# Patient Record
Sex: Male | Born: 1940 | Race: White | Hispanic: No | Smoking: Never smoker
Health system: Southern US, Community
[De-identification: ages and names within clinical notes are randomized; demographics above are authoritative.]

## PROBLEM LIST (undated history)

## (undated) DIAGNOSIS — M479 Spondylosis, unspecified: Secondary | ICD-10-CM

## (undated) DIAGNOSIS — Z973 Presence of spectacles and contact lenses: Secondary | ICD-10-CM

## (undated) DIAGNOSIS — M810 Age-related osteoporosis without current pathological fracture: Secondary | ICD-10-CM

## (undated) DIAGNOSIS — K5790 Diverticulosis of intestine, part unspecified, without perforation or abscess without bleeding: Secondary | ICD-10-CM

## (undated) DIAGNOSIS — I10 Essential (primary) hypertension: Secondary | ICD-10-CM

## (undated) DIAGNOSIS — K219 Gastro-esophageal reflux disease without esophagitis: Secondary | ICD-10-CM

## (undated) DIAGNOSIS — Z87448 Personal history of other diseases of urinary system: Secondary | ICD-10-CM

## (undated) DIAGNOSIS — J309 Allergic rhinitis, unspecified: Secondary | ICD-10-CM

## (undated) DIAGNOSIS — N529 Male erectile dysfunction, unspecified: Secondary | ICD-10-CM

## (undated) DIAGNOSIS — Z794 Long term (current) use of insulin: Secondary | ICD-10-CM

## (undated) DIAGNOSIS — T4145XA Adverse effect of unspecified anesthetic, initial encounter: Secondary | ICD-10-CM

## (undated) DIAGNOSIS — M1990 Unspecified osteoarthritis, unspecified site: Secondary | ICD-10-CM

## (undated) DIAGNOSIS — T8859XA Other complications of anesthesia, initial encounter: Secondary | ICD-10-CM

## (undated) DIAGNOSIS — M5136 Other intervertebral disc degeneration, lumbar region: Secondary | ICD-10-CM

## (undated) DIAGNOSIS — N401 Enlarged prostate with lower urinary tract symptoms: Secondary | ICD-10-CM

## (undated) DIAGNOSIS — K635 Polyp of colon: Secondary | ICD-10-CM

## (undated) DIAGNOSIS — E785 Hyperlipidemia, unspecified: Secondary | ICD-10-CM

## (undated) DIAGNOSIS — L570 Actinic keratosis: Secondary | ICD-10-CM

## (undated) DIAGNOSIS — Z87442 Personal history of urinary calculi: Secondary | ICD-10-CM

## (undated) DIAGNOSIS — E119 Type 2 diabetes mellitus without complications: Secondary | ICD-10-CM

## (undated) DIAGNOSIS — M4800 Spinal stenosis, site unspecified: Secondary | ICD-10-CM

## (undated) HISTORY — DX: Other intervertebral disc degeneration, lumbar region: M51.36

## (undated) HISTORY — DX: Polyp of colon: K63.5

## (undated) HISTORY — DX: Diverticulosis of intestine, part unspecified, without perforation or abscess without bleeding: K57.90

## (undated) HISTORY — DX: Hyperlipidemia, unspecified: E78.5

## (undated) HISTORY — DX: Male erectile dysfunction, unspecified: N52.9

## (undated) HISTORY — PX: COLONOSCOPY: SHX174

## (undated) HISTORY — PX: FRACTURE SURGERY: SHX138

## (undated) HISTORY — DX: Spinal stenosis, site unspecified: M48.00

## (undated) HISTORY — DX: Actinic keratosis: L57.0

## (undated) HISTORY — DX: Spondylosis, unspecified: M47.9

## (undated) HISTORY — DX: Age-related osteoporosis without current pathological fracture: M81.0

## (undated) HISTORY — DX: Allergic rhinitis, unspecified: J30.9

## (undated) HISTORY — DX: Essential (primary) hypertension: I10

---

## 1973-07-02 HISTORY — PX: APPENDECTOMY: SHX54

## 1999-04-27 ENCOUNTER — Encounter: Admission: RE | Admit: 1999-04-27 | Discharge: 1999-04-27 | Payer: Self-pay

## 2001-09-08 ENCOUNTER — Ambulatory Visit (HOSPITAL_COMMUNITY): Admission: RE | Admit: 2001-09-08 | Discharge: 2001-09-08 | Payer: Self-pay | Admitting: Internal Medicine

## 2001-09-08 ENCOUNTER — Encounter: Payer: Self-pay | Admitting: Internal Medicine

## 2002-09-09 ENCOUNTER — Encounter: Admission: RE | Admit: 2002-09-09 | Discharge: 2002-12-08 | Payer: Self-pay | Admitting: Internal Medicine

## 2007-10-21 ENCOUNTER — Ambulatory Visit: Payer: Self-pay | Admitting: Internal Medicine

## 2007-11-04 ENCOUNTER — Ambulatory Visit: Payer: Self-pay | Admitting: Internal Medicine

## 2012-05-20 ENCOUNTER — Encounter: Payer: Self-pay | Admitting: Internal Medicine

## 2012-06-18 ENCOUNTER — Other Ambulatory Visit (INDEPENDENT_AMBULATORY_CARE_PROVIDER_SITE_OTHER): Payer: Medicare Other

## 2012-06-18 ENCOUNTER — Ambulatory Visit (INDEPENDENT_AMBULATORY_CARE_PROVIDER_SITE_OTHER): Payer: Medicare Other | Admitting: Internal Medicine

## 2012-06-18 ENCOUNTER — Encounter: Payer: Self-pay | Admitting: Internal Medicine

## 2012-06-18 VITALS — BP 128/90 | HR 88 | Ht 67.75 in | Wt 189.4 lb

## 2012-06-18 DIAGNOSIS — R195 Other fecal abnormalities: Secondary | ICD-10-CM

## 2012-06-18 DIAGNOSIS — R197 Diarrhea, unspecified: Secondary | ICD-10-CM

## 2012-06-18 DIAGNOSIS — K529 Noninfective gastroenteritis and colitis, unspecified: Secondary | ICD-10-CM

## 2012-06-18 MED ORDER — NA SULFATE-K SULFATE-MG SULF 17.5-3.13-1.6 GM/177ML PO SOLN: ORAL | Status: DC

## 2012-06-18 NOTE — Progress Notes (Signed)
Subjective:    Patient ID: Zachary Vaughn, male    DOB: 03-07-1941, 71 y.o.   MRN: [id number] Referred by: Ezequiel Kayser, MD HPI This man recently submitted a Hemosure test that was +. He also c/o chronic diarrhea with many of his stools loose and frequent with 4+ stools a day. He has some nocturnal defecation. Says has been thius way for many years though he has not mentioned in the past. He has had 2 colonoscopies in 2004 and 2009 with 7 and then 5 mm adenomas removed each time. No unintentional weight loss or rectal bleeding, hematochezia. He does not have abdominal pain.He does say that metformin has exacerbated loose stools and ? Initiated, but not sure. "I have always had loose stools". Now on Janumet. Also says an MVI led to diarrhea. No hx celiac disease or FHx of that. No Known Allergies Outpatient Prescriptions Prior to Visit  Medication Sig Dispense Refill  . alendronate (FOSAMAX) 70 MG tablet Take 70 mg by mouth every 7 (seven) days. Take with a full glass of water on an empty stomach.      Marland Kitchen aspirin 81 MG tablet Take 81 mg by mouth daily.      Marland Kitchen glimepiride (AMARYL) 2 MG tablet Take 2 mg by mouth daily before breakfast. Takes 1/2 tablet      . ibuprofen (ADVIL,MOTRIN) 200 MG tablet Take 200 mg by mouth every 6 (six) hours as needed.      . ramipril (ALTACE) 2.5 MG capsule Take 2.5 mg by mouth daily.      . rosuvastatin (CRESTOR) 20 MG tablet Take 10 mg by mouth. 1/2 of 20mg  every day      . sitaGLIPtan-metformin (JANUMET) 50-1000 MG per tablet Take 1 tablet by mouth 2 (two) times daily with a meal.      . [DISCONTINUED] azithromycin (ZITHROMAX) 500 MG tablet Take 500 mg by mouth daily.      . [DISCONTINUED] Multiple Vitamin (MULTIVITAMIN) tablet Take 1 tablet by mouth daily.       Last reviewed on 06/18/2012 11:14 AM by Iva Boop, MD Past Medical History  Diagnosis Date  . Actinic keratosis   . Polyp of colon, adenomatous 9562,1308  . Allergic rhinitis     seasonal   .  Hyperlipidemia   . BPH (benign prostatic hyperplasia)   . DDD (degenerative disc disease), lumbar   . DM (diabetes mellitus)   . Erectile dysfunction   . Hypertension   . Osteoporosis   . Spinal stenosis   . Spondylosis   . Kidney stone   . History of shingles 2001  . Diverticulosis    Past Surgical History  Procedure Date  . Colonoscopy   . Appendectomy   . Tib/fib fx 10/2008    left leg, rod put in   History   Social History  . Marital Status: Divorced    Spouse Name: N/A    Number of Children: 2  . Years of Education: N/A   Occupational History  . retired [employer]   Social History Main Topics  . Smoking status: Never Smoker   . Smokeless tobacco: Never Used  . Alcohol Use: No  . Drug Use: No  . Sexually Active: None   Other Topics Concern  . None   Social History Narrative  . None   Family History  Problem Relation Age of Onset  . Pulmonary fibrosis Mother   . Pancreatic cancer Sister     intraabdominal tumor  .  Liver disease Father   . Alcoholism Father        Review of Systems + sinus trouble, joint pain/arthritis, cough, pedal edema All other ROS negative except as per HPI    Objective:   Physical Exam General:  Well-developed, well-nourished and in no acute distress Eyes:  anicteric. ENT:   Mouth and posterior pharynx free of lesions.  Neck:   supple w/o thyromegaly or mass.  Lungs: Clear to auscultation bilaterally. Heart:  S1S2, no rubs, murmurs, gallops. Abdomen:  soft, non-tender, no hepatosplenomegaly, hernia, or mass and BS+.  Rectal: deferred Lymph:  no cervical or supraclavicular adenopathy. Extremities:   no edema Skin   no rash. Neuro:  A&O x 3.  Psych:  appropriate mood and  Affect.   Data Reviewed: Prior colonoscopies PCP notes, lab     Assessment & Plan:   1. Heme + stool   2. Chronic diarrhea    1. TTG Ab and IgA to evaluate for celiac disease, ? A cause of the chronic diarrhea 2. Colonoscopy to evaluate heme  + stool and diarrhea The risks, benefits, and alternatives to endoscopy with possible biopsy and possible dilation were discussed with the patient and they consent to proceed.   I appreciate the opportunity to care for this patient.  WJ:XBJYNW,GNFA A, MD

## 2012-06-18 NOTE — Patient Instructions (Addendum)
You have been scheduled for a colonoscopy with propofol. Please follow written instructions given to you at your visit today.  Please pick up your prep kit at the pharmacy within the next 1-3 days. If you use inhalers (even only as needed) or a CPAP machine, please bring them with you on the day of your procedure.  Your physician has requested that you go to the basement for the following lab work before leaving today: TTG, IGA  Thank you for choosing me and [facility].  Iva Boop, M.D., [facility]

## 2012-06-19 NOTE — Progress Notes (Signed)
Quick Note:  Let him know he does not have celiac disease ______

## 2012-07-01 ENCOUNTER — Encounter: Payer: Self-pay | Admitting: Internal Medicine

## 2012-07-01 ENCOUNTER — Ambulatory Visit (AMBULATORY_SURGERY_CENTER): Payer: Medicare Other | Admitting: Internal Medicine

## 2012-07-01 VITALS — BP 140/71 | HR 74 | Temp 98.8°F | Resp 14 | Ht 67.0 in | Wt 189.0 lb

## 2012-07-01 DIAGNOSIS — Z860100 Personal history of colon polyps, unspecified: Secondary | ICD-10-CM | POA: Insufficient documentation

## 2012-07-01 DIAGNOSIS — K648 Other hemorrhoids: Secondary | ICD-10-CM

## 2012-07-01 DIAGNOSIS — D126 Benign neoplasm of colon, unspecified: Secondary | ICD-10-CM

## 2012-07-01 DIAGNOSIS — R197 Diarrhea, unspecified: Secondary | ICD-10-CM

## 2012-07-01 DIAGNOSIS — R195 Other fecal abnormalities: Secondary | ICD-10-CM

## 2012-07-01 DIAGNOSIS — K5730 Diverticulosis of large intestine without perforation or abscess without bleeding: Secondary | ICD-10-CM

## 2012-07-01 MED ORDER — SODIUM CHLORIDE 0.9 % IV SOLN: 500.0000 mL | INTRAVENOUS | Status: DC

## 2012-07-01 NOTE — Patient Instructions (Addendum)
I removed two polyps. I saw diverticulosis and hemorrhoids also. The hemorrhoids caused the blood in the stool. You should avoid routine testing of the stool for blood in the future as the hemorrhoids will make it +.  The rest of the exam looked normal but I took biopsies of the colon lining to see if it tells [place] why you have diarrhea.  My office will call with results and recommendations. I will be sure Dr. Waynard Edwards is aware also.  Thank you for choosing me and [facility].  Iva Boop, MD, FACG  YOU HAD AN ENDOSCOPIC PROCEDURE TODAY AT THE [facility]: Refer to the procedure report that was given to you for any specific questions about what was found during the examination.  If the procedure report does not answer your questions, please call your gastroenterologist to clarify.  If you requested that your care partner not be given the details of your procedure findings, then the procedure report has been included in a sealed envelope for you to review at your convenience later.  YOU SHOULD EXPECT: Some feelings of bloating in the abdomen. Passage of more gas than usual.  Walking can help get rid of the air that was put into your GI tract during the procedure and reduce the bloating. If you had a lower endoscopy (such as a colonoscopy or flexible sigmoidoscopy) you may notice spotting of blood in your stool or on the toilet paper. If you underwent a bowel prep for your procedure, then you may not have a normal bowel movement for a few days.  DIET: Your first meal following the procedure should be a light meal and then it is ok to progress to your normal diet.  A half-sandwich or bowl of soup is an example of a good first meal.  Heavy or fried foods are harder to digest and may make you feel nauseous or bloated.  Likewise meals heavy in dairy and vegetables can cause extra gas to form and this can also increase the bloating.  Drink plenty of fluids but you should avoid  alcoholic beverages for 24 hours.  ACTIVITY: Your care partner should take you home directly after the procedure.  You should plan to take it easy, moving slowly for the rest of the day.  You can resume normal activity the day after the procedure however you should NOT DRIVE or use heavy machinery for 24 hours (because of the sedation medicines used during the test).    SYMPTOMS TO REPORT IMMEDIATELY: A gastroenterologist can be reached at any hour.  During normal business hours, 8:30 AM to 5:00 PM Monday through Friday, call [phone].  After hours and on weekends, please call the GI answering service at [phone] who will take a message and have the physician on call contact you.   Following lower endoscopy (colonoscopy or flexible sigmoidoscopy):  Excessive amounts of blood in the stool  Significant tenderness or worsening of abdominal pains  Swelling of the abdomen that is new, acute  Fever of 100F or higher FOLLOW UP: If any biopsies were taken you will be contacted by phone or by letter within the next 1-3 weeks.  Call your gastroenterologist if you have not heard about the biopsies in 3 weeks.  Our staff will call the home number listed on your records the next business day following your procedure to check on you and address any questions or concerns that you may have at that time regarding the information given to  you following your procedure. This is a courtesy call and so if there is no answer at the home number and we have not heard from you through the emergency physician on call, we will assume that you have returned to your regular daily activities without incident.  SIGNATURES/CONFIDENTIALITY: You and/or your care partner have signed paperwork which will be entered into your electronic medical record.  These signatures attest to the fact that that the information above on your After Visit Summary has been reviewed and is understood.  Full responsibility of the  confidentiality of this discharge information lies with you and/or your care-partner.

## 2012-07-01 NOTE — Op Note (Signed)
[facility] [address] [place], [address]   COLONOSCOPY PROCEDURE REPORT  PATIENT: Zachary Vaughn, Zachary Vaughn.  MR#: [id number] BIRTHDATE: 1941/03/22 , 71  yrs. old GENDER: Male ENDOSCOPIST: Iva Boop, MD, [facility] PROCEDURE DATE:  07/01/2012 PROCEDURE:   Colonoscopy with biopsy and Colonoscopy with snare polypectomy ASA CLASS:   Class III INDICATIONS:chronic diarrhea, heme-positive stool, and Patient's personal history of adenomatous colon polyps. MEDICATIONS: propofol (Diprivan) 200mg  IV, MAC sedation, administered by CRNA, and These medications were titrated to patient response per physician's verbal order  DESCRIPTION OF PROCEDURE:   After the risks benefits and alternatives of the procedure were thoroughly explained, informed consent was obtained.  A digital rectal exam revealed no abnormalities of the rectum and A digital rectal exam revealed the prostate was not enlarged.   The LB [id number]  endoscope was introduced through the anus and advanced to the terminal ileum which was intubated for a short distance. No adverse events experienced.   The quality of the prep was Suprep excellent  The instrument was then slowly withdrawn as the colon was fully examined.      COLON FINDINGS: Two polypoid shaped sessile polyps ranging between 3-69mm in size were found at the ileocecal valve and cecum.  A polypectomy was performed with a cold snare.  The resection was complete and the polyp tissue was completely retrieved.   The mucosa appeared normal in the terminal ileum.   Diverticulosis was noted.   Moderate sized internal hemorrhoids were found.   The colon mucosa was otherwise normal, including right colon retroflexion. Random colon biopsies were taken and sent to pathology.  Retroflexed views revealed internal hemorrhoids. The time to cecum=1 minutes 28 seconds.  Withdrawal time=11 minutes 19 seconds.  The scope was withdrawn and the procedure  completed. COMPLICATIONS: There were no complications.  ENDOSCOPIC IMPRESSION: 1.   Two sessile polyps ranging between 3-22mm in size were found at the ileocecal valve and cecum; polypectomy was performed with a cold snare 2.   Normal mucosa in the terminal ileum 3.   Diverticulosis was noted - sigmoid colon 4.   Moderate sized internal hemorrhoids - cause of heme + stool, I think 5.   The colon mucosa was otherwise normal - random biopsies taken to evaluate diarrhea 6.    Personal history of adenomas in 2004 and 2009      RECOMMENDATIONS: 1.  Timing of repeat colonoscopy will be determined by pathology findings. 2.  Office will call with the results and plans - diarrhea may be from metformin - celiac serology is negative 3. Avoid routine hemoccults in future - high risk false + w/ hemorrhoids, he will be on routine colonoscopy plan   eSigned:  Iva Boop, MD, [facility] 07/01/2012 1:53 PM   cc: Rodrigo Ran, MD and The Patient   PATIENT NAME:  Zachary Vaughn, Zachary Vaughn. MR#: [id number]

## 2012-07-01 NOTE — Progress Notes (Signed)
Patient did not experience any of the following events: a burn prior to discharge; a fall within the facility; wrong site/side/patient/procedure/implant event; or a hospital transfer or hospital admission upon discharge from the facility. (G8907) Patient did not have preoperative order for IV antibiotic SSI prophylaxis. (G8918)  

## 2012-07-03 ENCOUNTER — Telehealth: Payer: Self-pay

## 2012-07-03 NOTE — Telephone Encounter (Signed)
  Follow up Call-  Call back number 07/01/2012  Post procedure Call Back phone  # [phone]  Permission to leave phone message Yes     Patient questions:  Do you have a fever, pain , or abdominal swelling? no Pain Score  0 *  Have you tolerated food without any problems? yes  Have you been able to return to your normal activities? yes  Do you have any questions about your discharge instructions: Diet   no Medications  no Follow up visit  no  Do you have questions or concerns about your Care? no  Actions: * If pain score is 4 or above: No action needed, pain <4.  No problems per the pt. Maw

## 2012-07-13 ENCOUNTER — Encounter: Payer: Self-pay | Admitting: Internal Medicine

## 2012-07-13 NOTE — Progress Notes (Signed)
Quick Note:  Polyps were hyperplastic - given hx adenomas and that these were from cecal area - repeat colon 5 yrs  Call from office - benign polyps repeat in 06/2017  Colon biopsies ok - think metformin may be causing the diarrhea - ask him to see about holding the metformin under guidance of Dr. Waynard Edwards to see if diarrhea resolves If that is not the problem then return to me and we can try something else  [facility]  No letter 5 year recall 06/2017 colonoscopy ______

## 2013-06-12 ENCOUNTER — Encounter (HOSPITAL_COMMUNITY): Payer: Self-pay | Admitting: Emergency Medicine

## 2013-06-12 ENCOUNTER — Emergency Department (HOSPITAL_COMMUNITY)
Admission: EM | Admit: 2013-06-12 | Discharge: 2013-06-12 | Disposition: A | Discharge status: Home / Self Care | Payer: Medicare Other | Attending: Emergency Medicine | Admitting: Emergency Medicine

## 2013-06-12 DIAGNOSIS — Z872 Personal history of diseases of the skin and subcutaneous tissue: Secondary | ICD-10-CM | POA: Insufficient documentation

## 2013-06-12 DIAGNOSIS — K4090 Unilateral inguinal hernia, without obstruction or gangrene, not specified as recurrent: Secondary | ICD-10-CM | POA: Insufficient documentation

## 2013-06-12 DIAGNOSIS — Z860100 Personal history of colon polyps, unspecified: Secondary | ICD-10-CM | POA: Insufficient documentation

## 2013-06-12 DIAGNOSIS — E785 Hyperlipidemia, unspecified: Secondary | ICD-10-CM | POA: Insufficient documentation

## 2013-06-12 DIAGNOSIS — I10 Essential (primary) hypertension: Secondary | ICD-10-CM | POA: Insufficient documentation

## 2013-06-12 DIAGNOSIS — R34 Anuria and oliguria: Secondary | ICD-10-CM | POA: Insufficient documentation

## 2013-06-12 DIAGNOSIS — M810 Age-related osteoporosis without current pathological fracture: Secondary | ICD-10-CM | POA: Insufficient documentation

## 2013-06-12 DIAGNOSIS — Z87442 Personal history of urinary calculi: Secondary | ICD-10-CM | POA: Insufficient documentation

## 2013-06-12 DIAGNOSIS — R339 Retention of urine, unspecified: Secondary | ICD-10-CM | POA: Insufficient documentation

## 2013-06-12 DIAGNOSIS — Z8619 Personal history of other infectious and parasitic diseases: Secondary | ICD-10-CM | POA: Insufficient documentation

## 2013-06-12 DIAGNOSIS — Z79899 Other long term (current) drug therapy: Secondary | ICD-10-CM | POA: Insufficient documentation

## 2013-06-12 DIAGNOSIS — M5137 Other intervertebral disc degeneration, lumbosacral region: Secondary | ICD-10-CM | POA: Insufficient documentation

## 2013-06-12 DIAGNOSIS — E119 Type 2 diabetes mellitus without complications: Secondary | ICD-10-CM | POA: Insufficient documentation

## 2013-06-12 DIAGNOSIS — Z7982 Long term (current) use of aspirin: Secondary | ICD-10-CM | POA: Insufficient documentation

## 2013-06-12 DIAGNOSIS — M51379 Other intervertebral disc degeneration, lumbosacral region without mention of lumbar back pain or lower extremity pain: Secondary | ICD-10-CM | POA: Insufficient documentation

## 2013-06-12 DIAGNOSIS — N400 Enlarged prostate without lower urinary tract symptoms: Secondary | ICD-10-CM | POA: Insufficient documentation

## 2013-06-12 LAB — BASIC METABOLIC PANEL
GFR calc Af Amer: >90 mL/min (ref >90)
GFR calc non Af Amer: 84 mL/min — ABNORMAL LOW (ref >90)
Glucose, Bld: 130 mg/dL — ABNORMAL HIGH (ref 70–99)
Potassium: 3.8 mEq/L (ref 3.5–5.1)
Sodium: 134 mEq/L — ABNORMAL LOW (ref 135–145)

## 2013-06-12 LAB — CBC WITH DIFFERENTIAL/PLATELET
Basophils Absolute: 0 10*3/uL (ref 0.0–0.1)
Basophils Relative: 0 % (ref 0–1)
Eosinophils Absolute: 0.1 10*3/uL (ref 0.0–0.7)
Lymphs Abs: 1.3 10*3/uL (ref 0.7–4.0)
MCH: 30.7 pg (ref 26.0–34.0)
MCV: 85.8 fL (ref 78.0–100.0)
Neutro Abs: 15.7 10*3/uL — ABNORMAL HIGH (ref 1.7–7.7)
Neutrophils Relative %: 88 % — ABNORMAL HIGH (ref 43–77)
Platelets: 194 10*3/uL (ref 150–400)
RBC: 4.85 MIL/uL (ref 4.22–5.81)

## 2013-06-12 LAB — URINALYSIS, ROUTINE W REFLEX MICROSCOPIC
Nitrite: NEGATIVE
Specific Gravity, Urine: 1.018 (ref 1.005–1.030)
Urobilinogen, UA: 0.2 mg/dL (ref 0.0–1.0)

## 2013-06-12 NOTE — ED Notes (Signed)
Per pt, started having dysuria last night-increase retention today-saw PCP and he advised ED cath

## 2013-06-12 NOTE — ED Provider Notes (Signed)
CSN: [id number]     Arrival date & time 06/12/13  1627 History   First MD Initiated Contact with Patient 06/12/13 1635     Chief Complaint  Patient presents with  . Urinary Retention   (Consider location/radiation/quality/duration/timing/severity/associated sxs/prior Treatment) The history is provided by the patient.  LUIE LANEVE is a 72 y.o. male history of BPH, diabetes, hypertension here presenting with urinary retention. He has history of enlarged prostate and usually dribbles when he urinates. Since 9 pm yesterday, he was unable to urinate at all. Went to PMD and sent here for eval.    Past Medical History  Diagnosis Date  . Actinic keratosis   . Polyp of colon, adenomatous 4098,1191  . Allergic rhinitis     seasonal   . Hyperlipidemia   . BPH (benign prostatic hyperplasia)   . DDD (degenerative disc disease), lumbar   . DM (diabetes mellitus)   . Erectile dysfunction   . Hypertension   . Osteoporosis   . Spinal stenosis   . Spondylosis   . Kidney stone   . History of shingles 2001  . Diverticulosis    Past Surgical History  Procedure Laterality Date  . Colonoscopy    . Appendectomy    . Tib/fib fx  10/2008    left leg, rod put in   Family History  Problem Relation Age of Onset  . Pulmonary fibrosis Mother   . Pancreatic cancer Sister     intraabdominal tumor  . Liver disease Father   . Alcoholism Father    History  Substance Use Topics  . Smoking status: Never Smoker   . Smokeless tobacco: Never Used  . Alcohol Use: No    Review of Systems  Gastrointestinal: Positive for abdominal pain.  Genitourinary: Positive for decreased urine volume.  All other systems reviewed and are negative.    Allergies  Review of patient's allergies indicates no known allergies.  Home Medications   Current Outpatient Rx  Name  Route  Sig  Dispense  Refill  . alendronate (FOSAMAX) 70 MG tablet   Oral   Take 70 mg by mouth every 7 (seven) days. Take with a full  glass of water on an empty stomach. Patient normally takes on Wed.         Marland Kitchen aspirin 81 MG tablet   Oral   Take 81 mg by mouth daily.         Marland Kitchen glimepiride (AMARYL) 2 MG tablet   Oral   Take 2 mg by mouth daily before breakfast. Takes 1/2 tablet         . ibuprofen (ADVIL,MOTRIN) 200 MG tablet   Oral   Take 200 mg by mouth every 6 (six) hours as needed.         . ramipril (ALTACE) 2.5 MG capsule   Oral   Take 2.5 mg by mouth daily.         . rosuvastatin (CRESTOR) 20 MG tablet   Oral   Take 10 mg by mouth. 1/2 of 20mg  every day         . sitaGLIPtan-metformin (JANUMET) 50-1000 MG per tablet   Oral   Take 1 tablet by mouth 2 (two) times daily with a meal.          BP 150/100  Pulse 108  Temp(Src) 97.8 F (36.6 C) (Oral)  Resp 18  SpO2 100% Physical Exam  Nursing note and vitals reviewed. Constitutional: He is oriented to person, place, and time.  Uncomfortable   HENT:  Head: Normocephalic.  Mouth/Throat: Oropharynx is clear and moist.  Eyes: Conjunctivae are normal. Pupils are equal, round, and reactive to light.  Neck: Normal range of motion. Neck supple.  Cardiovascular: Normal rate, regular rhythm and normal heart sounds.   Pulmonary/Chest: Effort normal and breath sounds normal. No respiratory distress. He has no wheezes. He has no rales.  Abdominal: Soft.  + bladder enlarged, + suprapubic tenderness, no CVAT   Musculoskeletal: Normal range of motion. He exhibits no edema and no tenderness.  Neurological: He is alert and oriented to person, place, and time.  Skin: Skin is warm and dry.  Psychiatric: He has a normal mood and affect. His behavior is normal. Judgment and thought content normal.    ED Course  Procedures (including critical care time) Labs Review Labs Reviewed  CBC WITH DIFFERENTIAL - Abnormal; Notable for the following:    WBC 17.9 (*)    Neutrophils Relative % 88 (*)    Neutro Abs 15.7 (*)    Lymphocytes Relative 7 (*)    All  other components within normal limits  BASIC METABOLIC PANEL - Abnormal; Notable for the following:    Sodium 134 (*)    Glucose, Bld 130 (*)    GFR calc non Af Amer 84 (*)    All other components within normal limits  URINALYSIS, ROUTINE W REFLEX MICROSCOPIC - Abnormal; Notable for the following:    Glucose, UA 500 (*)    All other components within normal limits   Imaging Review No results found.  EKG Interpretation   None       MDM  No diagnosis found. LATHAM KINZLER is a 72 y.o. male here with urinary retention. Bladder scan showed bladder volume of 800 cc. Foley placed by nursing. Will check UA and labs.   6:41 PM 750 cc came out initially. Prostate enlarged, nontender. There is external hernia (likely from straining) that is not thrombosed. No evidence of post obstructive diuresis after 1 hr of observation. Will d/c home with foley and have him f/u with urologist.      Richardean Canal, MD 06/12/13 [phone]

## 2015-08-09 DIAGNOSIS — E784 Other hyperlipidemia: Secondary | ICD-10-CM | POA: Diagnosis not present

## 2015-08-09 DIAGNOSIS — E119 Type 2 diabetes mellitus without complications: Secondary | ICD-10-CM | POA: Diagnosis not present

## 2015-08-09 DIAGNOSIS — I10 Essential (primary) hypertension: Secondary | ICD-10-CM | POA: Diagnosis not present

## 2015-08-09 DIAGNOSIS — M810 Age-related osteoporosis without current pathological fracture: Secondary | ICD-10-CM | POA: Diagnosis not present

## 2015-08-09 DIAGNOSIS — Z125 Encounter for screening for malignant neoplasm of prostate: Secondary | ICD-10-CM | POA: Diagnosis not present

## 2015-08-16 DIAGNOSIS — M25511 Pain in right shoulder: Secondary | ICD-10-CM | POA: Diagnosis not present

## 2015-08-16 DIAGNOSIS — Z0000 Encounter for general adult medical examination without abnormal findings: Secondary | ICD-10-CM | POA: Diagnosis not present

## 2015-08-16 DIAGNOSIS — E663 Overweight: Secondary | ICD-10-CM | POA: Diagnosis not present

## 2015-08-16 DIAGNOSIS — M5136 Other intervertebral disc degeneration, lumbar region: Secondary | ICD-10-CM | POA: Diagnosis not present

## 2015-08-16 DIAGNOSIS — N529 Male erectile dysfunction, unspecified: Secondary | ICD-10-CM | POA: Diagnosis not present

## 2015-08-16 DIAGNOSIS — N401 Enlarged prostate with lower urinary tract symptoms: Secondary | ICD-10-CM | POA: Diagnosis not present

## 2015-08-16 DIAGNOSIS — D126 Benign neoplasm of colon, unspecified: Secondary | ICD-10-CM | POA: Diagnosis not present

## 2015-08-16 DIAGNOSIS — R338 Other retention of urine: Secondary | ICD-10-CM | POA: Diagnosis not present

## 2015-08-16 DIAGNOSIS — R252 Cramp and spasm: Secondary | ICD-10-CM | POA: Diagnosis not present

## 2015-08-16 DIAGNOSIS — L570 Actinic keratosis: Secondary | ICD-10-CM | POA: Diagnosis not present

## 2015-08-23 DIAGNOSIS — E119 Type 2 diabetes mellitus without complications: Secondary | ICD-10-CM | POA: Diagnosis not present

## 2015-08-24 DIAGNOSIS — E119 Type 2 diabetes mellitus without complications: Secondary | ICD-10-CM | POA: Diagnosis not present

## 2015-08-29 DIAGNOSIS — R338 Other retention of urine: Secondary | ICD-10-CM | POA: Diagnosis not present

## 2015-08-29 DIAGNOSIS — N401 Enlarged prostate with lower urinary tract symptoms: Secondary | ICD-10-CM | POA: Diagnosis not present

## 2015-08-29 DIAGNOSIS — H5203 Hypermetropia, bilateral: Secondary | ICD-10-CM | POA: Diagnosis not present

## 2015-08-29 DIAGNOSIS — Z0000 Encounter for general adult medical examination without abnormal findings: Secondary | ICD-10-CM | POA: Diagnosis not present

## 2015-08-29 DIAGNOSIS — R351 Nocturia: Secondary | ICD-10-CM | POA: Diagnosis not present

## 2015-08-31 DIAGNOSIS — M5136 Other intervertebral disc degeneration, lumbar region: Secondary | ICD-10-CM | POA: Diagnosis not present

## 2015-08-31 DIAGNOSIS — M1611 Unilateral primary osteoarthritis, right hip: Secondary | ICD-10-CM | POA: Diagnosis not present

## 2015-09-01 DIAGNOSIS — E119 Type 2 diabetes mellitus without complications: Secondary | ICD-10-CM | POA: Diagnosis not present

## 2015-09-01 DIAGNOSIS — I10 Essential (primary) hypertension: Secondary | ICD-10-CM | POA: Diagnosis not present

## 2015-09-01 DIAGNOSIS — Z6828 Body mass index (BMI) 28.0-28.9, adult: Secondary | ICD-10-CM | POA: Diagnosis not present

## 2015-09-09 DIAGNOSIS — M545 Low back pain: Secondary | ICD-10-CM | POA: Diagnosis not present

## 2015-09-14 DIAGNOSIS — J209 Acute bronchitis, unspecified: Secondary | ICD-10-CM | POA: Diagnosis not present

## 2015-09-14 DIAGNOSIS — R062 Wheezing: Secondary | ICD-10-CM | POA: Diagnosis not present

## 2015-09-14 DIAGNOSIS — J019 Acute sinusitis, unspecified: Secondary | ICD-10-CM | POA: Diagnosis not present

## 2015-09-14 DIAGNOSIS — Z6827 Body mass index (BMI) 27.0-27.9, adult: Secondary | ICD-10-CM | POA: Diagnosis not present

## 2015-09-15 DIAGNOSIS — M1611 Unilateral primary osteoarthritis, right hip: Secondary | ICD-10-CM | POA: Diagnosis not present

## 2015-09-15 DIAGNOSIS — M5136 Other intervertebral disc degeneration, lumbar region: Secondary | ICD-10-CM | POA: Diagnosis not present

## 2015-09-28 DIAGNOSIS — M5136 Other intervertebral disc degeneration, lumbar region: Secondary | ICD-10-CM | POA: Diagnosis not present

## 2015-10-20 DIAGNOSIS — M5136 Other intervertebral disc degeneration, lumbar region: Secondary | ICD-10-CM | POA: Diagnosis not present

## 2015-10-20 DIAGNOSIS — E119 Type 2 diabetes mellitus without complications: Secondary | ICD-10-CM | POA: Diagnosis not present

## 2015-10-20 DIAGNOSIS — M4807 Spinal stenosis, lumbosacral region: Secondary | ICD-10-CM | POA: Diagnosis not present

## 2015-11-02 DIAGNOSIS — E119 Type 2 diabetes mellitus without complications: Secondary | ICD-10-CM | POA: Diagnosis not present

## 2015-11-02 DIAGNOSIS — I10 Essential (primary) hypertension: Secondary | ICD-10-CM | POA: Diagnosis not present

## 2015-11-02 DIAGNOSIS — Z6828 Body mass index (BMI) 28.0-28.9, adult: Secondary | ICD-10-CM | POA: Diagnosis not present

## 2015-11-09 DIAGNOSIS — M5136 Other intervertebral disc degeneration, lumbar region: Secondary | ICD-10-CM | POA: Diagnosis not present

## 2015-12-15 DIAGNOSIS — E119 Type 2 diabetes mellitus without complications: Secondary | ICD-10-CM | POA: Diagnosis not present

## 2016-01-10 DIAGNOSIS — E119 Type 2 diabetes mellitus without complications: Secondary | ICD-10-CM | POA: Diagnosis not present

## 2016-01-10 DIAGNOSIS — Z6822 Body mass index (BMI) 22.0-22.9, adult: Secondary | ICD-10-CM | POA: Diagnosis not present

## 2016-01-10 DIAGNOSIS — E784 Other hyperlipidemia: Secondary | ICD-10-CM | POA: Diagnosis not present

## 2016-01-10 DIAGNOSIS — I10 Essential (primary) hypertension: Secondary | ICD-10-CM | POA: Diagnosis not present

## 2016-02-08 DIAGNOSIS — I10 Essential (primary) hypertension: Secondary | ICD-10-CM | POA: Diagnosis not present

## 2016-02-08 DIAGNOSIS — M810 Age-related osteoporosis without current pathological fracture: Secondary | ICD-10-CM | POA: Diagnosis not present

## 2016-02-08 DIAGNOSIS — E119 Type 2 diabetes mellitus without complications: Secondary | ICD-10-CM | POA: Diagnosis not present

## 2016-02-08 DIAGNOSIS — Z6829 Body mass index (BMI) 29.0-29.9, adult: Secondary | ICD-10-CM | POA: Diagnosis not present

## 2016-02-29 DIAGNOSIS — E119 Type 2 diabetes mellitus without complications: Secondary | ICD-10-CM | POA: Diagnosis not present

## 2016-04-03 DIAGNOSIS — Z23 Encounter for immunization: Secondary | ICD-10-CM | POA: Diagnosis not present

## 2016-04-24 DIAGNOSIS — E119 Type 2 diabetes mellitus without complications: Secondary | ICD-10-CM | POA: Diagnosis not present

## 2016-05-07 DIAGNOSIS — Z6828 Body mass index (BMI) 28.0-28.9, adult: Secondary | ICD-10-CM | POA: Diagnosis not present

## 2016-05-07 DIAGNOSIS — E119 Type 2 diabetes mellitus without complications: Secondary | ICD-10-CM | POA: Diagnosis not present

## 2016-05-07 DIAGNOSIS — I10 Essential (primary) hypertension: Secondary | ICD-10-CM | POA: Diagnosis not present

## 2016-05-07 DIAGNOSIS — M810 Age-related osteoporosis without current pathological fracture: Secondary | ICD-10-CM | POA: Diagnosis not present

## 2016-05-07 DIAGNOSIS — M5136 Other intervertebral disc degeneration, lumbar region: Secondary | ICD-10-CM | POA: Diagnosis not present

## 2016-05-08 DIAGNOSIS — M5136 Other intervertebral disc degeneration, lumbar region: Secondary | ICD-10-CM | POA: Diagnosis not present

## 2016-08-13 DIAGNOSIS — E119 Type 2 diabetes mellitus without complications: Secondary | ICD-10-CM | POA: Diagnosis not present

## 2016-08-15 DIAGNOSIS — N401 Enlarged prostate with lower urinary tract symptoms: Secondary | ICD-10-CM | POA: Diagnosis not present

## 2016-08-15 DIAGNOSIS — R351 Nocturia: Secondary | ICD-10-CM | POA: Diagnosis not present

## 2016-08-22 DIAGNOSIS — Z125 Encounter for screening for malignant neoplasm of prostate: Secondary | ICD-10-CM | POA: Diagnosis not present

## 2016-08-22 DIAGNOSIS — E784 Other hyperlipidemia: Secondary | ICD-10-CM | POA: Diagnosis not present

## 2016-08-22 DIAGNOSIS — M810 Age-related osteoporosis without current pathological fracture: Secondary | ICD-10-CM | POA: Diagnosis not present

## 2016-08-22 DIAGNOSIS — I10 Essential (primary) hypertension: Secondary | ICD-10-CM | POA: Diagnosis not present

## 2016-08-29 DIAGNOSIS — M25551 Pain in right hip: Secondary | ICD-10-CM | POA: Diagnosis not present

## 2016-08-29 DIAGNOSIS — Z0000 Encounter for general adult medical examination without abnormal findings: Secondary | ICD-10-CM | POA: Diagnosis not present

## 2016-08-29 DIAGNOSIS — E119 Type 2 diabetes mellitus without complications: Secondary | ICD-10-CM | POA: Diagnosis not present

## 2016-08-29 DIAGNOSIS — M810 Age-related osteoporosis without current pathological fracture: Secondary | ICD-10-CM | POA: Diagnosis not present

## 2016-11-19 DIAGNOSIS — E119 Type 2 diabetes mellitus without complications: Secondary | ICD-10-CM | POA: Diagnosis not present

## 2016-12-19 DIAGNOSIS — I10 Essential (primary) hypertension: Secondary | ICD-10-CM | POA: Diagnosis not present

## 2016-12-19 DIAGNOSIS — M810 Age-related osteoporosis without current pathological fracture: Secondary | ICD-10-CM | POA: Diagnosis not present

## 2016-12-19 DIAGNOSIS — E119 Type 2 diabetes mellitus without complications: Secondary | ICD-10-CM | POA: Diagnosis not present

## 2016-12-19 DIAGNOSIS — M5136 Other intervertebral disc degeneration, lumbar region: Secondary | ICD-10-CM | POA: Diagnosis not present

## 2017-01-08 DIAGNOSIS — M5136 Other intervertebral disc degeneration, lumbar region: Secondary | ICD-10-CM | POA: Diagnosis not present

## 2017-01-16 DIAGNOSIS — E119 Type 2 diabetes mellitus without complications: Secondary | ICD-10-CM | POA: Diagnosis not present

## 2017-01-16 DIAGNOSIS — Z6830 Body mass index (BMI) 30.0-30.9, adult: Secondary | ICD-10-CM | POA: Diagnosis not present

## 2017-01-16 DIAGNOSIS — I10 Essential (primary) hypertension: Secondary | ICD-10-CM | POA: Diagnosis not present

## 2017-02-07 DIAGNOSIS — E119 Type 2 diabetes mellitus without complications: Secondary | ICD-10-CM | POA: Diagnosis not present

## 2017-04-06 DIAGNOSIS — E119 Type 2 diabetes mellitus without complications: Secondary | ICD-10-CM | POA: Diagnosis not present

## 2017-04-10 DIAGNOSIS — I10 Essential (primary) hypertension: Secondary | ICD-10-CM | POA: Diagnosis not present

## 2017-04-10 DIAGNOSIS — M810 Age-related osteoporosis without current pathological fracture: Secondary | ICD-10-CM | POA: Diagnosis not present

## 2017-04-10 DIAGNOSIS — E7849 Other hyperlipidemia: Secondary | ICD-10-CM | POA: Diagnosis not present

## 2017-04-10 DIAGNOSIS — E119 Type 2 diabetes mellitus without complications: Secondary | ICD-10-CM | POA: Diagnosis not present

## 2017-05-16 DIAGNOSIS — M4807 Spinal stenosis, lumbosacral region: Secondary | ICD-10-CM | POA: Diagnosis not present

## 2017-05-16 DIAGNOSIS — M5136 Other intervertebral disc degeneration, lumbar region: Secondary | ICD-10-CM | POA: Diagnosis not present

## 2017-05-21 DIAGNOSIS — Z6829 Body mass index (BMI) 29.0-29.9, adult: Secondary | ICD-10-CM | POA: Diagnosis not present

## 2017-05-21 DIAGNOSIS — I10 Essential (primary) hypertension: Secondary | ICD-10-CM | POA: Diagnosis not present

## 2017-05-21 DIAGNOSIS — Z794 Long term (current) use of insulin: Secondary | ICD-10-CM | POA: Diagnosis not present

## 2017-05-21 DIAGNOSIS — E119 Type 2 diabetes mellitus without complications: Secondary | ICD-10-CM | POA: Diagnosis not present

## 2017-05-29 DIAGNOSIS — E1159 Type 2 diabetes mellitus with other circulatory complications: Secondary | ICD-10-CM | POA: Diagnosis not present

## 2017-07-13 DIAGNOSIS — E1159 Type 2 diabetes mellitus with other circulatory complications: Secondary | ICD-10-CM | POA: Diagnosis not present

## 2017-07-16 DIAGNOSIS — E119 Type 2 diabetes mellitus without complications: Secondary | ICD-10-CM | POA: Diagnosis not present

## 2017-07-16 DIAGNOSIS — E7849 Other hyperlipidemia: Secondary | ICD-10-CM | POA: Diagnosis not present

## 2017-07-16 DIAGNOSIS — M4800 Spinal stenosis, site unspecified: Secondary | ICD-10-CM | POA: Diagnosis not present

## 2017-07-16 DIAGNOSIS — I10 Essential (primary) hypertension: Secondary | ICD-10-CM | POA: Diagnosis not present

## 2017-07-17 ENCOUNTER — Encounter: Payer: Self-pay | Admitting: Internal Medicine

## 2017-09-16 DIAGNOSIS — E1159 Type 2 diabetes mellitus with other circulatory complications: Secondary | ICD-10-CM | POA: Diagnosis not present

## 2017-09-19 ENCOUNTER — Encounter: Payer: Self-pay | Admitting: Internal Medicine

## 2017-09-25 DIAGNOSIS — I10 Essential (primary) hypertension: Secondary | ICD-10-CM | POA: Diagnosis not present

## 2017-09-25 DIAGNOSIS — Z6829 Body mass index (BMI) 29.0-29.9, adult: Secondary | ICD-10-CM | POA: Diagnosis not present

## 2017-09-25 DIAGNOSIS — Z794 Long term (current) use of insulin: Secondary | ICD-10-CM | POA: Diagnosis not present

## 2017-09-25 DIAGNOSIS — E119 Type 2 diabetes mellitus without complications: Secondary | ICD-10-CM | POA: Diagnosis not present

## 2017-10-09 DIAGNOSIS — E119 Type 2 diabetes mellitus without complications: Secondary | ICD-10-CM | POA: Diagnosis not present

## 2017-10-09 DIAGNOSIS — E7849 Other hyperlipidemia: Secondary | ICD-10-CM | POA: Diagnosis not present

## 2017-10-09 DIAGNOSIS — R82998 Other abnormal findings in urine: Secondary | ICD-10-CM | POA: Diagnosis not present

## 2017-10-09 DIAGNOSIS — M810 Age-related osteoporosis without current pathological fracture: Secondary | ICD-10-CM | POA: Diagnosis not present

## 2017-10-09 DIAGNOSIS — Z125 Encounter for screening for malignant neoplasm of prostate: Secondary | ICD-10-CM | POA: Diagnosis not present

## 2017-10-16 DIAGNOSIS — M4800 Spinal stenosis, site unspecified: Secondary | ICD-10-CM | POA: Diagnosis not present

## 2017-10-16 DIAGNOSIS — Z794 Long term (current) use of insulin: Secondary | ICD-10-CM | POA: Diagnosis not present

## 2017-10-16 DIAGNOSIS — Z0000 Encounter for general adult medical examination without abnormal findings: Secondary | ICD-10-CM | POA: Diagnosis not present

## 2017-10-16 DIAGNOSIS — M810 Age-related osteoporosis without current pathological fracture: Secondary | ICD-10-CM | POA: Diagnosis not present

## 2017-10-17 ENCOUNTER — Other Ambulatory Visit: Payer: Self-pay | Admitting: Internal Medicine

## 2017-10-17 DIAGNOSIS — M4800 Spinal stenosis, site unspecified: Secondary | ICD-10-CM

## 2017-10-17 DIAGNOSIS — M5136 Other intervertebral disc degeneration, lumbar region: Secondary | ICD-10-CM

## 2017-10-24 DIAGNOSIS — H524 Presbyopia: Secondary | ICD-10-CM | POA: Diagnosis not present

## 2017-10-24 DIAGNOSIS — E119 Type 2 diabetes mellitus without complications: Secondary | ICD-10-CM | POA: Diagnosis not present

## 2017-10-24 DIAGNOSIS — H2513 Age-related nuclear cataract, bilateral: Secondary | ICD-10-CM | POA: Diagnosis not present

## 2017-10-25 ENCOUNTER — Ambulatory Visit
Admission: RE | Admit: 2017-10-25 | Discharge: 2017-10-25 | Disposition: A | Discharge status: Home / Self Care | Payer: Medicare Other | Source: Ambulatory Visit | Attending: Internal Medicine | Admitting: Internal Medicine

## 2017-10-25 DIAGNOSIS — M5136 Other intervertebral disc degeneration, lumbar region: Secondary | ICD-10-CM

## 2017-10-25 DIAGNOSIS — M4800 Spinal stenosis, site unspecified: Secondary | ICD-10-CM

## 2017-10-25 DIAGNOSIS — M48061 Spinal stenosis, lumbar region without neurogenic claudication: Secondary | ICD-10-CM | POA: Diagnosis not present

## 2017-11-04 ENCOUNTER — Other Ambulatory Visit: Payer: Self-pay | Admitting: Neurological Surgery

## 2017-11-04 ENCOUNTER — Encounter: Payer: Self-pay | Admitting: *Deleted

## 2017-11-04 DIAGNOSIS — Z6830 Body mass index (BMI) 30.0-30.9, adult: Secondary | ICD-10-CM | POA: Diagnosis not present

## 2017-11-04 DIAGNOSIS — M48062 Spinal stenosis, lumbar region with neurogenic claudication: Secondary | ICD-10-CM | POA: Diagnosis not present

## 2017-11-04 DIAGNOSIS — I10 Essential (primary) hypertension: Secondary | ICD-10-CM | POA: Diagnosis not present

## 2017-11-13 NOTE — Pre-Procedure Instructions (Signed)
Zachary Vaughn  11/13/2017      [place] (SE), [place] [address] [place] ([place]) [place] [address] Phone: [phone] Fax: [phone]    Your procedure is scheduled on Thursday May 23.  Report to [facility] Admitting at 12:50 A.M.  Call this number if you have problems the morning of surgery:  [phone]   Remember:  Do not eat food or drink liquids after midnight.  Take these medicines the morning of surgery with A SIP OF WATER: omeprazole (prilosec)  DO NOT TAKE metformin (glucophage) or Pioglitazone (Actos) the day of surgery  Take Half Dose of Toujeo the night before surgery (25 units)   7 days prior to surgery STOP taking any Aspirin(unless otherwise instructed by your surgeon), Aleve, Naproxen, Ibuprofen, Motrin, Advil, Goody's, BC's, all herbal medications, fish oil, and all vitamins     How to Manage Your Diabetes Before and After Surgery  Why is it important to control my blood sugar before and after surgery? . Improving blood sugar levels before and after surgery helps healing and can limit problems. . A way of improving blood sugar control is eating a healthy diet by: o  Eating less sugar and carbohydrates o  Increasing activity/exercise o  Talking with your doctor about reaching your blood sugar goals . High blood sugars (greater than 180 mg/dL) can raise your risk of infections and slow your recovery, so you will need to focus on controlling your diabetes during the weeks before surgery. . Make sure that the doctor who takes care of your diabetes knows about your planned surgery including the date and location.  How do I manage my blood sugar before surgery? . Check your blood sugar at least 4 times a day, starting 2 days before surgery, to make sure that the level is not too high or low. o Check your blood sugar the morning of your surgery when you wake up and every 2 hours until you get to the  Short Stay unit. . If your blood sugar is less than 70 mg/dL, you will need to treat for low blood sugar: o Do not take insulin. o Treat a low blood sugar (less than 70 mg/dL) with  cup of clear juice (cranberry or apple), 4 glucose tablets, OR glucose gel. Recheck blood sugar in 15 minutes after treatment (to make sure it is greater than 70 mg/dL). If your blood sugar is not greater than 70 mg/dL on recheck, call [phone] o  for further instructions. . Report your blood sugar to the short stay nurse when you get to Short Stay.  . If you are admitted to the hospital after surgery: o Your blood sugar will be checked by the staff and you will probably be given insulin after surgery (instead of oral diabetes medicines) to make sure you have good blood sugar levels. o The goal for blood sugar control after surgery is 80-180 mg/dL.           Do not wear jewelry, make-up or nail polish.  Do not wear lotions, powders, or perfumes, or deodorant.  Do not shave 48 hours prior to surgery.  Men may shave face and neck.  Do not bring valuables to the hospital.  [facility] is not responsible for any belongings or valuables.  Contacts, dentures or bridgework may not be worn into surgery.  Leave your suitcase in the car.  After surgery it may be brought  to your room.  For patients admitted to the hospital, discharge time will be determined by your treatment team.  Patients discharged the day of surgery will not be allowed to drive home.   Special instructions:    [place]- Preparing For Surgery  Before surgery, you can play an important role. Because skin is not sterile, your skin needs to be as free of germs as possible. You can reduce the number of germs on your skin by washing with CHG (chlorahexidine gluconate) Soap before surgery.  CHG is an antiseptic cleaner which kills germs and bonds with the skin to continue killing germs even after washing.  Oral Hygiene is also important to  reduce your risk of infection.  Remember - BRUSH YOUR TEETH THE MORNING OF SURGERY  Please do not use if you have an allergy to CHG or antibacterial soaps. If your skin becomes reddened/irritated stop using the CHG.  Do not shave (including legs and underarms) for at least 48 hours prior to first CHG shower. It is OK to shave your face.  Please follow these instructions carefully.   1. Shower the NIGHT BEFORE SURGERY and the MORNING OF SURGERY with CHG.   2. If you chose to wash your hair, wash your hair first as usual with your normal shampoo.  3. After you shampoo, rinse your hair and body thoroughly to remove the shampoo.  4. Use CHG as you would any other liquid soap. You can apply CHG directly to the skin and wash gently with a scrungie or a clean washcloth.   5. Apply the CHG Soap to your body ONLY FROM THE NECK DOWN.  Do not use on open wounds or open sores. Avoid contact with your eyes, ears, mouth and genitals (private parts). Wash Face and genitals (private parts)  with your normal soap.  6. Wash thoroughly, paying special attention to the area where your surgery will be performed.  7. Thoroughly rinse your body with warm water from the neck down.  8. DO NOT shower/wash with your normal soap after using and rinsing off the CHG Soap.  9. Pat yourself dry with a CLEAN TOWEL.  10. Wear CLEAN PAJAMAS to bed the night before surgery, wear comfortable clothes the morning of surgery  11. Place CLEAN SHEETS on your bed the night of your first shower and DO NOT SLEEP WITH PETS.    Day of Surgery:  Do not apply any deodorants/lotions.  Please wear clean clothes to the hospital/surgery center.   Remember to brush your teeth.      Please read over the following fact sheets that you were given. Coughing and Deep Breathing, MRSA Information and Surgical Site Infection Prevention

## 2017-11-14 ENCOUNTER — Ambulatory Visit (HOSPITAL_COMMUNITY)
Admission: RE | Admit: 2017-11-14 | Discharge: 2017-11-14 | Disposition: A | Discharge status: Home / Self Care | Payer: Medicare Other | Source: Ambulatory Visit | Attending: Neurological Surgery | Admitting: Neurological Surgery

## 2017-11-14 ENCOUNTER — Other Ambulatory Visit: Payer: Self-pay

## 2017-11-14 ENCOUNTER — Encounter (HOSPITAL_COMMUNITY)
Admission: RE | Admit: 2017-11-14 | Discharge: 2017-11-14 | Disposition: A | Discharge status: Home / Self Care | Payer: Medicare Other | Source: Ambulatory Visit | Attending: Neurological Surgery | Admitting: Neurological Surgery

## 2017-11-14 ENCOUNTER — Encounter (HOSPITAL_COMMUNITY): Payer: Self-pay

## 2017-11-14 DIAGNOSIS — Z7982 Long term (current) use of aspirin: Secondary | ICD-10-CM | POA: Diagnosis not present

## 2017-11-14 DIAGNOSIS — M48061 Spinal stenosis, lumbar region without neurogenic claudication: Secondary | ICD-10-CM | POA: Insufficient documentation

## 2017-11-14 DIAGNOSIS — K219 Gastro-esophageal reflux disease without esophagitis: Secondary | ICD-10-CM | POA: Diagnosis not present

## 2017-11-14 DIAGNOSIS — E119 Type 2 diabetes mellitus without complications: Secondary | ICD-10-CM | POA: Diagnosis not present

## 2017-11-14 DIAGNOSIS — I10 Essential (primary) hypertension: Secondary | ICD-10-CM | POA: Insufficient documentation

## 2017-11-14 DIAGNOSIS — Z01810 Encounter for preprocedural cardiovascular examination: Secondary | ICD-10-CM | POA: Diagnosis not present

## 2017-11-14 DIAGNOSIS — Z7984 Long term (current) use of oral hypoglycemic drugs: Secondary | ICD-10-CM | POA: Diagnosis not present

## 2017-11-14 DIAGNOSIS — E785 Hyperlipidemia, unspecified: Secondary | ICD-10-CM | POA: Diagnosis not present

## 2017-11-14 DIAGNOSIS — M4326 Fusion of spine, lumbar region: Secondary | ICD-10-CM | POA: Diagnosis not present

## 2017-11-14 DIAGNOSIS — Z79899 Other long term (current) drug therapy: Secondary | ICD-10-CM | POA: Diagnosis not present

## 2017-11-14 HISTORY — DX: Personal history of urinary calculi: Z87.442

## 2017-11-14 HISTORY — DX: Gastro-esophageal reflux disease without esophagitis: K21.9

## 2017-11-14 LAB — CBC WITH DIFFERENTIAL/PLATELET
Abs Immature Granulocytes: 0.1 10*3/uL (ref 0.0–0.1)
Basophils Absolute: 0 10*3/uL (ref 0.0–0.1)
Basophils Relative: 1 %
Eosinophils Absolute: 0.2 10*3/uL (ref 0.0–0.7)
Eosinophils Relative: 3 %
HCT: 45.6 % (ref 39.0–52.0)
Hemoglobin: 15.1 g/dL (ref 13.0–17.0)
IMMATURE GRANULOCYTES: 1 %
LYMPHS ABS: 1.5 10*3/uL (ref 0.7–4.0)
Lymphocytes Relative: 22 %
MCH: 29 pg (ref 26.0–34.0)
MCHC: 33.1 g/dL (ref 30.0–36.0)
MCV: 87.7 fL (ref 78.0–100.0)
Monocytes Absolute: 0.5 10*3/uL (ref 0.1–1.0)
Monocytes Relative: 7 %
NEUTROS ABS: 4.6 10*3/uL (ref 1.7–7.7)
Neutrophils Relative %: 66 %
Platelets: 153 10*3/uL (ref 150–400)
RBC: 5.2 MIL/uL (ref 4.22–5.81)
RDW: 13.2 % (ref 11.5–15.5)
WBC: 6.9 10*3/uL (ref 4.0–10.5)

## 2017-11-14 LAB — BASIC METABOLIC PANEL
Anion gap: 9 (ref 5–15)
BUN: 9 mg/dL (ref 6–20)
CHLORIDE: 105 mmol/L (ref 101–111)
CO2: 26 mmol/L (ref 22–32)
Calcium: 9 mg/dL (ref 8.9–10.3)
Creatinine, Ser: 1.01 mg/dL (ref 0.61–1.24)
GFR calc Af Amer: >60 mL/min (ref >60)
GFR calc non Af Amer: >60 mL/min (ref >60)
Glucose, Bld: 111 mg/dL — ABNORMAL HIGH (ref 65–99)
POTASSIUM: 4.2 mmol/L (ref 3.5–5.1)
Sodium: 140 mmol/L (ref 135–145)

## 2017-11-14 LAB — PROTIME-INR
INR: 1.19
Prothrombin Time: 15 seconds (ref 11.4–15.2)

## 2017-11-14 LAB — SURGICAL PCR SCREEN
MRSA, PCR: NEGATIVE
Staphylococcus aureus: POSITIVE — AB

## 2017-11-14 LAB — GLUCOSE, CAPILLARY: Glucose-Capillary: 106 mg/dL — ABNORMAL HIGH (ref 65–99)

## 2017-11-14 NOTE — Progress Notes (Signed)
PCP is Dr. Crist Infante  Zachary Vaughn 09/2017  [phone]  (he also manages his diabetes medication along with pharmacist Oretha Ellis) Checks blood sugars every morning, ranges from 81-140 He believes his last Z7G was 7.3 (uncertain of when it was done) I have called office to get his LOV and lab results. Denies any heart murmur, cardiac testing or seeing a cardio.

## 2017-11-14 NOTE — Pre-Procedure Instructions (Signed)
Zachary Vaughn  11/14/2017      [place] (SE), [place] [address] [place] ([place]) [place] [address] Phone: [phone] Fax: [phone]    Your procedure is scheduled on Thursday May 23.   Report to [facility] Admitting at 12:50 PM             (posted surgery time 2:50p - 4:46p)   Call this number if you have problems the morning of surgery:  [phone]   Remember:   Do not eat food or drink liquids after midnight, Wednesday.   Take these medicines the morning of surgery with A SIP OF WATER: omeprazole (prilosec)  DO NOT TAKE metformin (glucophage) or Pioglitazone (Actos) the day of surgery  Take Half Dose of Toujeo the night before surgery (25 units)   7 days prior to surgery STOP taking any Aspirin(unless otherwise instructed by your surgeon), Aleve, Naproxen, Ibuprofen, Motrin, Advil, Goody's, BC's, all herbal medications, fish oil, and all vitamins     How to Manage Your Diabetes Before and After Surgery  Why is it important to control my blood sugar before and after surgery? . Improving blood sugar levels before and after surgery helps healing and can limit problems. . A way of improving blood sugar control is eating a healthy diet by: o  Eating less sugar and carbohydrates o  Increasing activity/exercise o  Talking with your doctor about reaching your blood sugar goals . High blood sugars (greater than 180 mg/dL) can raise your risk of infections and slow your recovery, so you will need to focus on controlling your diabetes during the weeks before surgery. . Make sure that the doctor who takes care of your diabetes knows about your planned surgery including the date and location.  How do I manage my blood sugar before surgery? . Check your blood sugar at least 4 times a day, starting 2 days before surgery, to make sure that the level is not too high or low. o Check your blood sugar the morning of  your surgery when you wake up and every 2 hours until you get to the Short Stay unit. o  . If your blood sugar is less than 70 mg/dL, you will need to treat for low blood sugar: o Do not take insulin. o Treat a low blood sugar (less than 70 mg/dL) with  cup of clear juice (cranberry or apple), 4 glucose tablets, OR glucose gel. o  Recheck blood sugar in 15 minutes after treatment (to make sure it is greater than 70 mg/dL). If your blood sugar is not greater than 70 mg/dL on recheck, call [phone] o  for further instructions. . Report your blood sugar to the short stay nurse when you get to Short Stay.  . If you are admitted to the hospital after surgery: o Your blood sugar will be checked by the staff and you will probably be given insulin after surgery (instead of oral diabetes medicines) to make sure you have good blood sugar levels. o The goal for blood sugar control after surgery is 80-180 mg/dL.    Do not wear jewelry - no rings or watches.  Do not wear lotions, colognes or deodorant.   Men may shave face and neck.  Do not bring valuables to the hospital.  [facility] is not responsible for any belongings or valuables.  Contacts, dentures or bridgework may not be worn into surgery.  Leave your  suitcase in the car.  After surgery it may be brought to your room.  For patients admitted to the hospital, discharge time will be determined by your treatment team.    [facility]- Preparing For Surgery  Before surgery, you can play an important role. Because skin is not sterile, your skin needs to be as free of germs as possible. You can reduce the number of germs on your skin by washing with CHG (chlorahexidine gluconate) Soap before surgery.  CHG is an antiseptic cleaner which kills germs and bonds with the skin to continue killing germs even after washing.  Oral Hygiene is also important to reduce your risk of infection.  Remember - BRUSH YOUR TEETH THE MORNING OF SURGERY  Please  do not use if you have an allergy to CHG or antibacterial soaps. If your skin becomes reddened/irritated stop using the CHG.  Do not shave (including legs and underarms) for at least 48 hours prior to first CHG shower. It is OK to shave your face.  Please follow these instructions carefully.   1. Shower the NIGHT BEFORE SURGERY and the MORNING OF SURGERY with CHG.   2. If you chose to wash your hair, wash your hair first as usual with your normal shampoo.  3. After you shampoo, rinse your hair and body thoroughly to remove the shampoo.  4. Use CHG as you would any other liquid soap. You can apply CHG directly to the skin and wash gently with a scrungie or a clean washcloth.   5. Apply the CHG Soap to your body ONLY FROM THE NECK DOWN.  Do not use on open wounds or open sores. Avoid contact with your eyes, ears, mouth and genitals (private parts). Wash Face and genitals (private parts)  with your normal soap.  6. Wash thoroughly, paying special attention to the area where your surgery will be performed.  7. Thoroughly rinse your body with warm water from the neck down.  8. DO NOT shower/wash with your normal soap after using and rinsing off the CHG Soap.  9. Pat yourself dry with a CLEAN TOWEL.  10. Wear CLEAN PAJAMAS to bed the night before surgery, wear comfortable clothes the morning of surgery  11. Place CLEAN SHEETS on your bed the night of your first shower and DO NOT SLEEP WITH PETS.  Day of Surgery:  Do not apply any deodorants/lotions.  Please wear clean clothes to the hospital/surgery center.   Remember to brush your teeth.    Please read over the following fact sheets that you were given. Coughing and Deep Breathing, MRSA Information and Surgical Site Infection Prevention

## 2017-11-15 LAB — HEMOGLOBIN A1C
Hgb A1c MFr Bld: 7 % — ABNORMAL HIGH (ref 4.8–5.6)
Mean Plasma Glucose: 154 mg/dL

## 2017-11-21 ENCOUNTER — Ambulatory Visit (HOSPITAL_COMMUNITY): Payer: Medicare Other | Admitting: Certified Registered"

## 2017-11-21 ENCOUNTER — Ambulatory Visit (HOSPITAL_COMMUNITY): Payer: Medicare Other

## 2017-11-21 ENCOUNTER — Observation Stay (HOSPITAL_COMMUNITY)
Admission: RE | Admit: 2017-11-21 | Discharge: 2017-11-22 | Disposition: A | Discharge status: Home / Self Care | Payer: Medicare Other | Source: Ambulatory Visit | Attending: Neurological Surgery | Admitting: Neurological Surgery

## 2017-11-21 ENCOUNTER — Encounter (HOSPITAL_COMMUNITY): Payer: Self-pay | Admitting: Urology

## 2017-11-21 ENCOUNTER — Ambulatory Visit (HOSPITAL_COMMUNITY)
Admission: RE | Disposition: A | Discharge status: Home / Self Care | Payer: Self-pay | Source: Ambulatory Visit | Attending: Neurological Surgery

## 2017-11-21 DIAGNOSIS — Z8619 Personal history of other infectious and parasitic diseases: Secondary | ICD-10-CM | POA: Insufficient documentation

## 2017-11-21 DIAGNOSIS — Z79899 Other long term (current) drug therapy: Secondary | ICD-10-CM | POA: Insufficient documentation

## 2017-11-21 DIAGNOSIS — K219 Gastro-esophageal reflux disease without esophagitis: Secondary | ICD-10-CM | POA: Insufficient documentation

## 2017-11-21 DIAGNOSIS — E785 Hyperlipidemia, unspecified: Secondary | ICD-10-CM | POA: Diagnosis not present

## 2017-11-21 DIAGNOSIS — Z981 Arthrodesis status: Secondary | ICD-10-CM | POA: Diagnosis not present

## 2017-11-21 DIAGNOSIS — I10 Essential (primary) hypertension: Secondary | ICD-10-CM | POA: Diagnosis not present

## 2017-11-21 DIAGNOSIS — M810 Age-related osteoporosis without current pathological fracture: Secondary | ICD-10-CM | POA: Insufficient documentation

## 2017-11-21 DIAGNOSIS — N529 Male erectile dysfunction, unspecified: Secondary | ICD-10-CM | POA: Diagnosis not present

## 2017-11-21 DIAGNOSIS — N400 Enlarged prostate without lower urinary tract symptoms: Secondary | ICD-10-CM | POA: Insufficient documentation

## 2017-11-21 DIAGNOSIS — E119 Type 2 diabetes mellitus without complications: Secondary | ICD-10-CM | POA: Insufficient documentation

## 2017-11-21 DIAGNOSIS — Z86010 Personal history of colonic polyps: Secondary | ICD-10-CM | POA: Diagnosis not present

## 2017-11-21 DIAGNOSIS — Z419 Encounter for procedure for purposes other than remedying health state, unspecified: Secondary | ICD-10-CM

## 2017-11-21 DIAGNOSIS — Z9889 Other specified postprocedural states: Secondary | ICD-10-CM

## 2017-11-21 DIAGNOSIS — M48062 Spinal stenosis, lumbar region with neurogenic claudication: Secondary | ICD-10-CM | POA: Diagnosis not present

## 2017-11-21 DIAGNOSIS — M48061 Spinal stenosis, lumbar region without neurogenic claudication: Principal | ICD-10-CM | POA: Insufficient documentation

## 2017-11-21 DIAGNOSIS — Z794 Long term (current) use of insulin: Secondary | ICD-10-CM | POA: Insufficient documentation

## 2017-11-21 HISTORY — PX: LUMBAR LAMINECTOMY/DECOMPRESSION MICRODISCECTOMY: SHX5026

## 2017-11-21 LAB — GLUCOSE, CAPILLARY
GLUCOSE-CAPILLARY: 117 mg/dL — AB (ref 65–99)
Glucose-Capillary: 251 mg/dL — ABNORMAL HIGH (ref 65–99)

## 2017-11-21 SURGERY — LUMBAR LAMINECTOMY/DECOMPRESSION MICRODISCECTOMY 2 LEVELS
Anesthesia: General | Site: Back | Laterality: Bilateral

## 2017-11-21 MED ORDER — SODIUM CHLORIDE 0.9% FLUSH
3.0000 mL | Freq: Two times a day (BID) | INTRAVENOUS | Status: DC
  Administered 2017-11-21: 3 mL via INTRAVENOUS

## 2017-11-21 MED ORDER — HYDROCODONE-ACETAMINOPHEN 7.5-325 MG PO TABS
1.0000 | ORAL_TABLET | Freq: Four times a day (QID) | ORAL | Status: DC
  Administered 2017-11-21 – 2017-11-22 (×3): 1 via ORAL
  Filled 2017-11-21 (×2): qty 1

## 2017-11-21 MED ORDER — PHENOL 1.4 % MT LIQD: 1.0000 | OROMUCOSAL | Status: DC | PRN

## 2017-11-21 MED ORDER — LACTATED RINGERS IV SOLN
INTRAVENOUS | Status: DC
  Administered 2017-11-21: 13:00:00 via INTRAVENOUS

## 2017-11-21 MED ORDER — THROMBIN 5000 UNITS EX SOLR
CUTANEOUS | Status: AC
Start: 2017-11-21 — End: ?
  Filled 2017-11-21: qty 5000

## 2017-11-21 MED ORDER — PIOGLITAZONE HCL 30 MG PO TABS
30.0000 mg | ORAL_TABLET | Freq: Every day | ORAL | Status: DC
  Administered 2017-11-21 – 2017-11-22 (×2): 30 mg via ORAL
  Filled 2017-11-21 (×2): qty 1

## 2017-11-21 MED ORDER — FENTANYL CITRATE (PF) 250 MCG/5ML IJ SOLN
INTRAMUSCULAR | Status: AC
  Filled 2017-11-21: qty 5

## 2017-11-21 MED ORDER — CELECOXIB 200 MG PO CAPS
200.0000 mg | ORAL_CAPSULE | Freq: Two times a day (BID) | ORAL | Status: DC
  Administered 2017-11-21 – 2017-11-22 (×2): 200 mg via ORAL
  Filled 2017-11-21 (×2): qty 1

## 2017-11-21 MED ORDER — CEFAZOLIN SODIUM-DEXTROSE 2-4 GM/100ML-% IV SOLN
INTRAVENOUS | Status: AC
  Filled 2017-11-21: qty 100

## 2017-11-21 MED ORDER — LACTATED RINGERS IV SOLN
INTRAVENOUS | Status: DC | PRN
  Administered 2017-11-21 (×3): via INTRAVENOUS

## 2017-11-21 MED ORDER — DEXAMETHASONE SODIUM PHOSPHATE 10 MG/ML IJ SOLN: 10.0000 mg | INTRAMUSCULAR | Status: DC

## 2017-11-21 MED ORDER — TAMSULOSIN HCL 0.4 MG PO CAPS
0.4000 mg | ORAL_CAPSULE | Freq: Every day | ORAL | Status: DC
  Administered 2017-11-21: 0.4 mg via ORAL
  Filled 2017-11-21: qty 1

## 2017-11-21 MED ORDER — ACETAMINOPHEN 10 MG/ML IV SOLN
INTRAVENOUS | Status: DC | PRN
  Administered 2017-11-21: 1000 mg via INTRAVENOUS

## 2017-11-21 MED ORDER — GELATIN ABSORBABLE MT POWD
OROMUCOSAL | Status: DC | PRN
  Administered 2017-11-21: 16:00:00 via TOPICAL

## 2017-11-21 MED ORDER — ONDANSETRON HCL 4 MG/2ML IJ SOLN
4.0000 mg | Freq: Four times a day (QID) | INTRAMUSCULAR | Status: DC | PRN
Start: 2017-11-21 — End: 2017-11-22

## 2017-11-21 MED ORDER — HYDROMORPHONE HCL 1 MG/ML IJ SOLN
0.5000 mg | INTRAMUSCULAR | Status: DC | PRN
  Administered 2017-11-21: 0.5 mg via INTRAVENOUS

## 2017-11-21 MED ORDER — SENNA 8.6 MG PO TABS
1.0000 | ORAL_TABLET | Freq: Two times a day (BID) | ORAL | Status: DC
  Administered 2017-11-21 – 2017-11-22 (×2): 8.6 mg via ORAL
  Filled 2017-11-21 (×2): qty 1

## 2017-11-21 MED ORDER — SODIUM CHLORIDE 0.9 % IV SOLN
INTRAVENOUS | Status: DC | PRN
  Administered 2017-11-21: 16:00:00

## 2017-11-21 MED ORDER — CEFAZOLIN SODIUM-DEXTROSE 2-4 GM/100ML-% IV SOLN
2.0000 g | INTRAVENOUS | Status: AC
  Administered 2017-11-21: 2 g via INTRAVENOUS

## 2017-11-21 MED ORDER — 0.9 % SODIUM CHLORIDE (POUR BTL) OPTIME
TOPICAL | Status: DC | PRN
  Administered 2017-11-21: 1000 mL

## 2017-11-21 MED ORDER — LIDOCAINE 2% (20 MG/ML) 5 ML SYRINGE
INTRAMUSCULAR | Status: DC | PRN
Start: 2017-11-21 — End: 2017-11-21
  Administered 2017-11-21: 50 mg via INTRAVENOUS

## 2017-11-21 MED ORDER — ROCURONIUM BROMIDE 10 MG/ML (PF) SYRINGE
PREFILLED_SYRINGE | INTRAVENOUS | Status: DC | PRN
  Administered 2017-11-21: 50 mg via INTRAVENOUS
  Administered 2017-11-21: 20 mg via INTRAVENOUS

## 2017-11-21 MED ORDER — SUGAMMADEX SODIUM 200 MG/2ML IV SOLN
INTRAVENOUS | Status: DC | PRN
  Administered 2017-11-21: 200 mg via INTRAVENOUS

## 2017-11-21 MED ORDER — SODIUM CHLORIDE 0.9 % IV SOLN: 250.0000 mL | INTRAVENOUS | Status: DC

## 2017-11-21 MED ORDER — HYDROCODONE-ACETAMINOPHEN 7.5-325 MG PO TABS
ORAL_TABLET | ORAL | Status: AC
  Filled 2017-11-21: qty 1

## 2017-11-21 MED ORDER — CEFAZOLIN SODIUM-DEXTROSE 2-4 GM/100ML-% IV SOLN
2.0000 g | Freq: Three times a day (TID) | INTRAVENOUS | Status: AC
  Administered 2017-11-21 – 2017-11-22 (×2): 2 g via INTRAVENOUS
  Filled 2017-11-21 (×2): qty 100

## 2017-11-21 MED ORDER — ACETAMINOPHEN 650 MG RE SUPP: 650.0000 mg | RECTAL | Status: DC | PRN

## 2017-11-21 MED ORDER — FENTANYL CITRATE (PF) 100 MCG/2ML IJ SOLN
INTRAMUSCULAR | Status: AC
  Filled 2017-11-21: qty 2

## 2017-11-21 MED ORDER — DEXAMETHASONE SODIUM PHOSPHATE 10 MG/ML IJ SOLN
INTRAMUSCULAR | Status: DC | PRN
  Administered 2017-11-21: 10 mg via INTRAVENOUS

## 2017-11-21 MED ORDER — ONDANSETRON HCL 4 MG/2ML IJ SOLN
INTRAMUSCULAR | Status: DC | PRN
  Administered 2017-11-21: 4 mg via INTRAVENOUS

## 2017-11-21 MED ORDER — BUPIVACAINE HCL (PF) 0.25 % IJ SOLN
INTRAMUSCULAR | Status: DC | PRN
  Administered 2017-11-21: 4 mL
  Administered 2017-11-21: 10 mL

## 2017-11-21 MED ORDER — RAMIPRIL 2.5 MG PO CAPS
2.5000 mg | ORAL_CAPSULE | Freq: Every day | ORAL | Status: DC
  Administered 2017-11-21 – 2017-11-22 (×2): 2.5 mg via ORAL
  Filled 2017-11-21 (×2): qty 1

## 2017-11-21 MED ORDER — HEMOSTATIC AGENTS (NO CHARGE) OPTIME
TOPICAL | Status: DC | PRN
  Administered 2017-11-21: 1 via TOPICAL

## 2017-11-21 MED ORDER — MENTHOL 3 MG MT LOZG: 1.0000 | LOZENGE | OROMUCOSAL | Status: DC | PRN

## 2017-11-21 MED ORDER — ACETAMINOPHEN 10 MG/ML IV SOLN
INTRAVENOUS | Status: AC
  Filled 2017-11-21: qty 100

## 2017-11-21 MED ORDER — METHOCARBAMOL 500 MG PO TABS
500.0000 mg | ORAL_TABLET | Freq: Four times a day (QID) | ORAL | Status: DC | PRN
  Administered 2017-11-22: 500 mg via ORAL
  Filled 2017-11-21: qty 1

## 2017-11-21 MED ORDER — THROMBIN 5000 UNITS EX SOLR
CUTANEOUS | Status: AC
  Filled 2017-11-21: qty 10000

## 2017-11-21 MED ORDER — SODIUM CHLORIDE 0.9% FLUSH: 3.0000 mL | INTRAVENOUS | Status: DC | PRN

## 2017-11-21 MED ORDER — BUPIVACAINE HCL (PF) 0.25 % IJ SOLN
INTRAMUSCULAR | Status: AC
  Filled 2017-11-21: qty 30

## 2017-11-21 MED ORDER — CHLORHEXIDINE GLUCONATE CLOTH 2 % EX PADS: 6.0000 | MEDICATED_PAD | Freq: Once | CUTANEOUS | Status: DC

## 2017-11-21 MED ORDER — METHOCARBAMOL 1000 MG/10ML IJ SOLN
500.0000 mg | Freq: Four times a day (QID) | INTRAVENOUS | Status: DC | PRN
  Filled 2017-11-21: qty 5

## 2017-11-21 MED ORDER — METFORMIN HCL 500 MG PO TABS
1000.0000 mg | ORAL_TABLET | Freq: Every day | ORAL | Status: DC
  Administered 2017-11-21 – 2017-11-22 (×2): 1000 mg via ORAL
  Filled 2017-11-21 (×2): qty 2

## 2017-11-21 MED ORDER — FENTANYL CITRATE (PF) 250 MCG/5ML IJ SOLN
INTRAMUSCULAR | Status: DC | PRN
  Administered 2017-11-21 (×3): 50 ug via INTRAVENOUS
  Administered 2017-11-21: 100 ug via INTRAVENOUS

## 2017-11-21 MED ORDER — DEXAMETHASONE SODIUM PHOSPHATE 10 MG/ML IJ SOLN
INTRAMUSCULAR | Status: AC
  Filled 2017-11-21: qty 1

## 2017-11-21 MED ORDER — INSULIN ASPART 100 UNIT/ML ~~LOC~~ SOLN: 0.0000 [IU] | Freq: Three times a day (TID) | SUBCUTANEOUS | Status: DC

## 2017-11-21 MED ORDER — THROMBIN (RECOMBINANT) 5000 UNITS EX SOLR
CUTANEOUS | Status: DC | PRN
  Administered 2017-11-21: 10000 [IU] via TOPICAL

## 2017-11-21 MED ORDER — HYDROMORPHONE HCL 2 MG/ML IJ SOLN
INTRAMUSCULAR | Status: AC
  Filled 2017-11-21: qty 1

## 2017-11-21 MED ORDER — POTASSIUM CHLORIDE IN NACL 20-0.9 MEQ/L-% IV SOLN: INTRAVENOUS | Status: DC

## 2017-11-21 MED ORDER — PROPOFOL 10 MG/ML IV BOLUS
INTRAVENOUS | Status: AC
  Filled 2017-11-21: qty 20

## 2017-11-21 MED ORDER — ONDANSETRON HCL 4 MG PO TABS: 4.0000 mg | ORAL_TABLET | Freq: Four times a day (QID) | ORAL | Status: DC | PRN

## 2017-11-21 MED ORDER — PROPOFOL 10 MG/ML IV BOLUS
INTRAVENOUS | Status: DC | PRN
  Administered 2017-11-21: 150 mg via INTRAVENOUS

## 2017-11-21 MED ORDER — ACETAMINOPHEN 325 MG PO TABS: 650.0000 mg | ORAL_TABLET | ORAL | Status: DC | PRN

## 2017-11-21 MED ORDER — FENTANYL CITRATE (PF) 100 MCG/2ML IJ SOLN
25.0000 ug | INTRAMUSCULAR | Status: DC | PRN
  Administered 2017-11-21 (×2): 50 ug via INTRAVENOUS

## 2017-11-21 SURGICAL SUPPLY — 47 items
ADH SKN CLS APL DERMABOND .7 (GAUZE/BANDAGES/DRESSINGS) ×1
APL SKNCLS STERI-STRIP NONHPOA (GAUZE/BANDAGES/DRESSINGS) ×1
BAG DECANTER FOR FLEXI CONT (MISCELLANEOUS) ×2 IMPLANT
BENZOIN TINCTURE PRP APPL 2/3 (GAUZE/BANDAGES/DRESSINGS) ×2 IMPLANT
BUR MATCHSTICK NEURO 3.0 LAGG (BURR) ×2 IMPLANT
CANISTER SUCT 3000ML PPV (MISCELLANEOUS) ×2 IMPLANT
CARTRIDGE OIL MAESTRO DRILL (MISCELLANEOUS) ×1 IMPLANT
DERMABOND ADVANCED (GAUZE/BANDAGES/DRESSINGS) ×1
DERMABOND ADVANCED .7 DNX12 (GAUZE/BANDAGES/DRESSINGS) ×1 IMPLANT
DIFFUSER DRILL AIR PNEUMATIC (MISCELLANEOUS) ×2 IMPLANT
DRAPE LAPAROTOMY 100X72X124 (DRAPES) ×2 IMPLANT
DRAPE MICROSCOPE LEICA (MISCELLANEOUS) IMPLANT
DRAPE POUCH INSTRU U-SHP 10X18 (DRAPES) ×2 IMPLANT
DRAPE SURG 17X23 STRL (DRAPES) ×2 IMPLANT
DRSG OPSITE POSTOP 4X6 (GAUZE/BANDAGES/DRESSINGS) ×2 IMPLANT
DURAPREP 26ML APPLICATOR (WOUND CARE) ×2 IMPLANT
ELECT REM PT RETURN 9FT ADLT (ELECTROSURGICAL) ×2
ELECTRODE REM PT RTRN 9FT ADLT (ELECTROSURGICAL) ×1 IMPLANT
GAUZE SPONGE 4X4 16PLY XRAY LF (GAUZE/BANDAGES/DRESSINGS) IMPLANT
GLOVE BIO SURGEON STRL SZ7 (GLOVE) IMPLANT
GLOVE BIO SURGEON STRL SZ8 (GLOVE) ×2 IMPLANT
GLOVE BIOGEL PI IND STRL 7.0 (GLOVE) IMPLANT
GLOVE BIOGEL PI INDICATOR 7.0 (GLOVE)
GOWN STRL REUS W/ TWL LRG LVL3 (GOWN DISPOSABLE) IMPLANT
GOWN STRL REUS W/ TWL XL LVL3 (GOWN DISPOSABLE) ×1 IMPLANT
GOWN STRL REUS W/TWL 2XL LVL3 (GOWN DISPOSABLE) IMPLANT
GOWN STRL REUS W/TWL LRG LVL3 (GOWN DISPOSABLE)
GOWN STRL REUS W/TWL XL LVL3 (GOWN DISPOSABLE) ×2
HEMOSTAT POWDER KIT SURGIFOAM (HEMOSTASIS) ×2 IMPLANT
KIT BASIN OR (CUSTOM PROCEDURE TRAY) ×2 IMPLANT
KIT TURNOVER KIT B (KITS) ×2 IMPLANT
NEEDLE HYPO 25X1 1.5 SAFETY (NEEDLE) ×2 IMPLANT
NEEDLE SPNL 20GX3.5 QUINCKE YW (NEEDLE) IMPLANT
NS IRRIG 1000ML POUR BTL (IV SOLUTION) ×2 IMPLANT
OIL CARTRIDGE MAESTRO DRILL (MISCELLANEOUS) ×2
PACK LAMINECTOMY NEURO (CUSTOM PROCEDURE TRAY) ×2 IMPLANT
PAD ARMBOARD 7.5X6 YLW CONV (MISCELLANEOUS) ×6 IMPLANT
RUBBERBAND STERILE (MISCELLANEOUS) IMPLANT
SPONGE SURGIFOAM ABS GEL SZ50 (HEMOSTASIS) IMPLANT
STRIP CLOSURE SKIN 1/2X4 (GAUZE/BANDAGES/DRESSINGS) ×2 IMPLANT
SUT VIC AB 0 CT1 18XCR BRD8 (SUTURE) ×1 IMPLANT
SUT VIC AB 0 CT1 8-18 (SUTURE) ×2
SUT VIC AB 2-0 CP2 18 (SUTURE) ×2 IMPLANT
SUT VIC AB 3-0 SH 8-18 (SUTURE) ×4 IMPLANT
TOWEL GREEN STERILE (TOWEL DISPOSABLE) ×2 IMPLANT
TOWEL GREEN STERILE FF (TOWEL DISPOSABLE) ×2 IMPLANT
WATER STERILE IRR 1000ML POUR (IV SOLUTION) ×2 IMPLANT

## 2017-11-21 NOTE — Transfer of Care (Signed)
Immediate Anesthesia Transfer of Care Note  Patient: Zachary Vaughn  Procedure(s) Performed: Laminectomy and Foraminotomy - Lumbar Three-Lumbar Four - Lumbar Four-Lumbar Five - bilateral (Bilateral Back)  Patient Location: PACU  Anesthesia Type:General  Level of Consciousness: awake, alert  and oriented  Airway & Oxygen Therapy: Patient Spontanous Breathing and Patient connected to face mask oxygen  Post-op Assessment: Report given to RN and Post -op Vital signs reviewed and stable  Post vital signs: Reviewed and stable  Last Vitals:  Vitals Value Taken Time  BP 128/84 11/21/2017  4:51 PM  Temp    Pulse 81 11/21/2017  4:53 PM  Resp 13 11/21/2017  4:53 PM  SpO2 99 % 11/21/2017  4:53 PM  Vitals shown include unvalidated device data.  Last Pain:  Vitals:   11/21/17 1242  TempSrc:   PainSc: 0-No pain         Complications: No apparent anesthesia complications

## 2017-11-21 NOTE — Plan of Care (Signed)

## 2017-11-21 NOTE — Anesthesia Preprocedure Evaluation (Addendum)
Anesthesia Evaluation  Patient identified by MRN, date of birth, ID band Patient awake    Reviewed: Allergy & Precautions, NPO status , Patient's Chart, lab work & pertinent test results  Airway Mallampati: II  TM Distance: >3 FB     Dental   Pulmonary neg pulmonary ROS,    breath sounds clear to auscultation       Cardiovascular hypertension, negative cardio ROS   Rhythm:Regular Rate:Normal     Neuro/Psych    GI/Hepatic GERD  ,  Endo/Other  diabetes  Renal/GU Renal disease     Musculoskeletal   Abdominal   Peds  Hematology   Anesthesia Other Findings   Reproductive/Obstetrics                             Anesthesia Physical Anesthesia Plan  ASA: III  Anesthesia Plan: General   Post-op Pain Management:    Induction: Intravenous  PONV Risk Score and Plan: 2 and Treatment may vary due to age or medical condition  Airway Management Planned: Oral ETT  Additional Equipment:   Intra-op Plan:   Post-operative Plan:   Informed Consent: I have reviewed the patients History and Physical, chart, labs and discussed the procedure including the risks, benefits and alternatives for the proposed anesthesia with the patient or authorized representative who has indicated his/her understanding and acceptance.   Dental advisory given  Plan Discussed with: CRNA and Anesthesiologist  Anesthesia Plan Comments:         Anesthesia Quick Evaluation

## 2017-11-21 NOTE — H&P (Signed)
Subjective: Patient is a 77 y.o. male admitted for stenosis. Onset of symptoms was several years ago, gradually worsening since that time.  The pain is rated severe, and is located at the across the lower back and radiates to legs. The pain is described as aching and occurs all day. The symptoms have been progressive. Symptoms are exacerbated by exercise. MRI or CT showed stenosis   Past Medical History:  Diagnosis Date  . Actinic keratosis   . Allergic rhinitis    seasonal   . BPH (benign prostatic hyperplasia)   . DDD (degenerative disc disease), lumbar   . Diverticulosis   . DM (diabetes mellitus) ([place])   . Erectile dysfunction   . GERD (gastroesophageal reflux disease)   . History of kidney stones    LAST FLARE UP 08/2017  . History of shingles 2001  . Hyperlipidemia   . Hypertension   . Kidney stone   . Osteoporosis   . Polyp of colon, adenomatous 2585,2778  . Spinal stenosis   . Spondylosis     Past Surgical History:  Procedure Laterality Date  . APPENDECTOMY    . COLONOSCOPY    . FRACTURE SURGERY     ROD DOWN IN LEFT LEG  . tib/fib fx  10/2008   left leg, rod put in    Prior to Admission medications   Medication Sig Start Date End Date Taking? Authorizing Provider  Aspirin-Caffeine (BC FAST PAIN RELIEF PO) Take 2 packets by mouth daily as needed (pain).   Yes [provider]  metFORMIN (GLUCOPHAGE) 1000 MG tablet Take 1,000 mg by mouth daily. 10/14/17  Yes [provider]  naproxen sodium (ALEVE) 220 MG tablet Take 440 mg by mouth daily as needed (pain).   Yes [provider]  omeprazole (PRILOSEC) 20 MG capsule Take 20 mg by mouth daily.   Yes [provider]  pioglitazone (ACTOS) 30 MG tablet Take 30 mg by mouth daily.   Yes [provider]  ramipril (ALTACE) 2.5 MG capsule Take 2.5 mg by mouth daily.   Yes [provider]  rosuvastatin (CRESTOR) 20 MG tablet Take 20 mg by mouth daily.    Yes [provider]  tamsulosin (FLOMAX) 0.4 MG CAPS capsule Take 0.4 mg by mouth at bedtime.   Yes [provider]  TOUJEO SOLOSTAR 300 UNIT/ML SOPN Inject 50 Units as directed at bedtime. 09/19/17  Yes [provider]   No Known Allergies  Social History   Tobacco Use  . Smoking status: Never Smoker  . Smokeless tobacco: Never Used  Substance Use Topics  . Alcohol use: No    Family History  Problem Relation Age of Onset  . Pulmonary fibrosis Mother   . Pancreatic cancer Sister        intraabdominal tumor  . Liver disease Father   . Alcoholism Father      Review of Systems  Positive ROS: neg  All other systems have been reviewed and were otherwise negative with the exception of those mentioned in the HPI and as above.  Objective: Vital signs in last 24 hours: Temp:  [98.7 F (37.1 C)] 98.7 F (37.1 C) (05/23 1229) Pulse Rate:  [94] 94 (05/23 1229) Resp:  [18] 18 (05/23 1229) BP: (161)/(96) 161/96 (05/23 1229) SpO2:  [98 %] 98 % (05/23 1229)  General Appearance: Alert, cooperative, no distress, appears stated age Head: Normocephalic, without obvious abnormality, atraumatic Eyes: PERRL, conjunctiva/corneas clear, EOM's intact    Neck: Supple, symmetrical,  trachea midline Back: Symmetric, no curvature, ROM normal, no CVA tenderness Lungs:  respirations unlabored Heart: Regular rate and rhythm Abdomen: Soft, non-tender Extremities: Extremities normal, atraumatic, no cyanosis or edema Pulses: 2+ and symmetric all extremities Skin: Skin color, texture, turgor normal, no rashes or lesions  NEUROLOGIC:   Mental status: Alert and oriented x4,  no aphasia, good attention span, fund of knowledge, and memory Motor Exam - grossly normal Sensory Exam - grossly normal Reflexes: trace Coordination - grossly normal Gait - grossly normal Balance - grossly normal Cranial Nerves: I: smell Not tested  II: visual acuity  OS: nl    OD: nl  II: visual fields Full to  confrontation  II: pupils Equal, round, reactive to light  III,VII: ptosis None  III,IV,VI: extraocular muscles  Full ROM  V: mastication Normal  V: facial light touch sensation  Normal  V,VII: corneal reflex  Present  VII: facial muscle function - upper  Normal  VII: facial muscle function - lower Normal  VIII: hearing Not tested  IX: soft palate elevation  Normal  IX,X: gag reflex Present  XI: trapezius strength  5/5  XI: sternocleidomastoid strength 5/5  XI: neck flexion strength  5/5  XII: tongue strength  Normal    Data Review Lab Results  Component Value Date   WBC 6.9 11/14/2017   HGB 15.1 11/14/2017   HCT 45.6 11/14/2017   MCV 87.7 11/14/2017   PLT 153 11/14/2017   Lab Results  Component Value Date   NA 140 11/14/2017   K 4.2 11/14/2017   CL 105 11/14/2017   CO2 26 11/14/2017   BUN 9 11/14/2017   CREATININE 1.01 11/14/2017   GLUCOSE 111 (H) 11/14/2017   Lab Results  Component Value Date   INR 1.19 11/14/2017    Assessment/Plan:  Estimated body mass index is 30.38 kg/m as calculated from the following:   Height as of 11/14/17: 5\' 8"  (1.727 m).   Weight as of 11/14/17: 90.6 kg (199 lb 12.8 oz). Patient admitted for DLL L2-5. Patient has failed a reasonable attempt at conservative therapy.  I explained the condition and procedure to the patient and answered any questions.  Patient wishes to proceed with procedure as planned. Understands risks/ benefits and typical outcomes of procedure.   Ladislav Caselli S 11/21/2017 2:05 PM

## 2017-11-21 NOTE — Op Note (Signed)
11/21/2017  4:45 PM  PATIENT:  Zachary Vaughn  77 y.o. male  PRE-OPERATIVE DIAGNOSIS:  Lumbar spinal stenosis L2-3 L3-4 L4-5 with back and bilateral leg numbness and right leg pain  POST-OPERATIVE DIAGNOSIS:  same  PROCEDURE:  Decompressive lumbar laminectomy, medial facetectomy foraminotomy L2-3 L3-4 and L4-5 bilaterally  SURGEON:  Sherley Bounds, MD  ASSISTANTS: Dr. Sherwood Gambler  ANESTHESIA:   General  EBL: 50 ml  Total I/O In: 2000 [I.V.:2000] Out: 50 [Blood:50]  BLOOD ADMINISTERED: none  DRAINS: None  SPECIMEN:  none  INDICATION FOR PROCEDURE: This patient presented with neurogenic claudication. Imaging showed severe spinal stenosis. The patient tried conservative measures without relief. Pain was debilitating. Recommended decompressive laminectomy. Patient understood the risks, benefits, and alternatives and potential outcomes and wished to proceed.  PROCEDURE DETAILS: The patient was taken to the operating room and after induction of adequate generalized endotracheal anesthesia, the patient was rolled into the prone position on the Wilson frame and all pressure points were padded. The lumbar region was cleaned and then prepped with DuraPrep and draped in the usual sterile fashion. 5 cc of local anesthesia was injected and then a dorsal midline incision was made and carried down to the lumbo sacral fascia. The fascia was opened and the paraspinous musculature was taken down in a subperiosteal fashion to expose L2-3 L3-4 and L4-5 bilaterally. Intraoperative x-ray confirmed my level, and then I removed the spinous processes of L3 and L4 and then used a combination of the high-speed drill and the Kerrison punches to perform laminectomy, medial facetectomy, and foraminotomy at L2-3 L3-4 and L4-5 bilaterally. The underlying yellow ligament was opened and removed in a piecemeal fashion to expose the underlying dura and exiting nerve root at each level. I undercut the lateral recess and  dissected down until I was medial to and distal to the pedicle at each level. The nerve root was well decompressed. I then palpated with a coronary dilator along the nerve root and into the foramen to assure adequate decompression. I felt no more compression of the nerve root. I irrigated with saline solution containing bacitracin. Achieved hemostasis with bipolar cautery, lined the dura with Gelfoam, and then closed the fascia with 0 Vicryl. I closed the subcutaneous tissues with 2-0 Vicryl and the subcuticular tissues with 3-0 Vicryl. The skin was then closed with benzoin and Steri-Strips. The drapes were removed, a sterile dressing was applied. The patient was awakened from general anesthesia and transferred to the recovery room in stable condition. At the end of the procedure all sponge, needle and instrument counts were correct.    PLAN OF CARE: Admit for overnight observation  PATIENT DISPOSITION:  PACU - hemodynamically stable.   Delay start of Pharmacological VTE agent (>24hrs) due to surgical blood loss or risk of bleeding:  yes

## 2017-11-21 NOTE — Anesthesia Procedure Notes (Signed)
Procedure Name: Intubation Date/Time: 11/21/2017 3:32 PM Performed by: Teressa Lower., CRNA Pre-anesthesia Checklist: Patient identified, Emergency Drugs available, Suction available and Patient being monitored Patient Re-evaluated:Patient Re-evaluated prior to induction Oxygen Delivery Method: Circle system utilized Preoxygenation: Pre-oxygenation with 100% oxygen Induction Type: IV induction and Cricoid Pressure applied Ventilation: Mask ventilation without difficulty and Oral airway inserted - appropriate to patient size Laryngoscope Size: Sabra Heck and 3 Grade View: Grade I Tube type: Oral Tube size: 7.5 mm Number of attempts: 1 Airway Equipment and Method: Stylet and Oral airway Placement Confirmation: ETT inserted through vocal cords under direct vision,  positive ETCO2 and breath sounds checked- equal and bilateral Secured at: 22 cm Tube secured with: Tape Dental Injury: Teeth and Oropharynx as per pre-operative assessment

## 2017-11-22 ENCOUNTER — Encounter (HOSPITAL_COMMUNITY): Payer: Self-pay | Admitting: Neurological Surgery

## 2017-11-22 ENCOUNTER — Other Ambulatory Visit: Payer: Self-pay

## 2017-11-22 DIAGNOSIS — N400 Enlarged prostate without lower urinary tract symptoms: Secondary | ICD-10-CM | POA: Diagnosis not present

## 2017-11-22 DIAGNOSIS — K219 Gastro-esophageal reflux disease without esophagitis: Secondary | ICD-10-CM | POA: Diagnosis not present

## 2017-11-22 DIAGNOSIS — Z79899 Other long term (current) drug therapy: Secondary | ICD-10-CM | POA: Diagnosis not present

## 2017-11-22 DIAGNOSIS — N529 Male erectile dysfunction, unspecified: Secondary | ICD-10-CM | POA: Diagnosis not present

## 2017-11-22 DIAGNOSIS — Z86010 Personal history of colonic polyps: Secondary | ICD-10-CM | POA: Diagnosis not present

## 2017-11-22 DIAGNOSIS — M48061 Spinal stenosis, lumbar region without neurogenic claudication: Secondary | ICD-10-CM | POA: Diagnosis not present

## 2017-11-22 DIAGNOSIS — M810 Age-related osteoporosis without current pathological fracture: Secondary | ICD-10-CM | POA: Diagnosis not present

## 2017-11-22 DIAGNOSIS — E785 Hyperlipidemia, unspecified: Secondary | ICD-10-CM | POA: Diagnosis not present

## 2017-11-22 DIAGNOSIS — Z794 Long term (current) use of insulin: Secondary | ICD-10-CM | POA: Diagnosis not present

## 2017-11-22 DIAGNOSIS — E119 Type 2 diabetes mellitus without complications: Secondary | ICD-10-CM | POA: Diagnosis not present

## 2017-11-22 DIAGNOSIS — Z8619 Personal history of other infectious and parasitic diseases: Secondary | ICD-10-CM | POA: Diagnosis not present

## 2017-11-22 DIAGNOSIS — I10 Essential (primary) hypertension: Secondary | ICD-10-CM | POA: Diagnosis not present

## 2017-11-22 LAB — GLUCOSE, CAPILLARY: Glucose-Capillary: 169 mg/dL — ABNORMAL HIGH (ref 65–99)

## 2017-11-22 MED ORDER — HYDROCODONE-ACETAMINOPHEN 7.5-325 MG PO TABS: 1.0000 | ORAL_TABLET | Freq: Four times a day (QID) | ORAL | 0 refills | Status: DC

## 2017-11-22 MED FILL — Thrombin For Soln 5000 Unit: CUTANEOUS | Qty: 2 | Status: AC

## 2017-11-22 MED FILL — Thrombin For Soln 5000 Unit: CUTANEOUS | Qty: 5000 | Status: AC

## 2017-11-22 NOTE — Progress Notes (Signed)
Patient is discharged from room 3C05 at this time. Alert and in stable condition. IV site d/c'd and instructions read to patient and daughter with understanding verbalized. Left unit via wheelchair with all belongings at side. 

## 2017-11-22 NOTE — Anesthesia Postprocedure Evaluation (Signed)
Anesthesia Post Note  Patient: MATTHE SLOANE  Procedure(s) Performed: Laminectomy and Foraminotomy - Lumbar Three-Lumbar Four - Lumbar Four-Lumbar Five - bilateral (Bilateral Back)     Patient location during evaluation: Other Anesthesia Type: General Level of consciousness: awake and alert Pain management: pain level controlled Vital Signs Assessment: post-procedure vital signs reviewed and stable Respiratory status: spontaneous breathing, nonlabored ventilation, respiratory function stable and patient connected to nasal cannula oxygen Cardiovascular status: blood pressure returned to baseline and stable Postop Assessment: no apparent nausea or vomiting Anesthetic complications: no    Last Vitals:  Vitals:   11/22/17 0334 11/22/17 0730  BP: 127/73 (!) 144/85  Pulse: 96 (!) 102  Resp: 18 18  Temp: 36.9 C 36.8 C  SpO2: 95% 96%    Last Pain:  Vitals:   11/22/17 0730  TempSrc: Oral  PainSc:                  Effie Berkshire

## 2017-11-22 NOTE — Discharge Summary (Signed)
Physician Discharge Summary  Patient ID: Zachary Vaughn MRN: [id number] DOB/AGE: 11/03/1940 77 y.o.  Admit date: 11/21/2017 Discharge date: 11/22/2017  Admission Diagnoses: lumbar stenosis    Discharge Diagnoses: same   Discharged Condition: good  Hospital Course: The patient was admitted on 11/21/2017 and taken to the operating room where the patient underwent LL L2-5. The patient tolerated the procedure well and was taken to the recovery room and then to the floor in stable condition. The hospital course was routine. There were no complications. The wound remained clean dry and intact. Pt had appropriate back soreness. No complaints of leg pain or new N/T/W. The patient remained afebrile with stable vital signs, and tolerated a regular diet. The patient continued to increase activities, and pain was well controlled with oral pain medications.   Consults: none  Significant Diagnostic Studies:  Results for orders placed or performed during the hospital encounter of 11/21/17  Glucose, capillary  Result Value Ref Range   Glucose-Capillary 117 (H) 65 - 99 mg/dL   Comment 1 Notify RN   Glucose, capillary  Result Value Ref Range   Glucose-Capillary 251 (H) 65 - 99 mg/dL   Comment 1 Notify RN    Comment 2 Document in Chart   Glucose, capillary  Result Value Ref Range   Glucose-Capillary 169 (H) 65 - 99 mg/dL   Comment 1 Notify RN    Comment 2 Document in Chart     Chest 2 View  Result Date: 11/14/2017 CLINICAL DATA:  Preop for lumbar spine fusion EXAM: CHEST - 2 VIEW COMPARISON:  None. FINDINGS: No active infiltrate or effusion is seen. Mediastinal and hilar contours are unremarkable. The heart is within upper limits of normal. A small hiatal hernia cannot be excluded. Only mild degenerative changes noted in the lower thoracic spine. IMPRESSION: No active lung disease.  Borderline cardiomegaly. Electronically Signed   By: Ivar Drape M.D.   On: 11/14/2017 15:12   Mr Lumbar Spine Wo  Contrast  Result Date: 10/25/2017 CLINICAL DATA:  Low back pain. BILATERAL leg numbness. Symptoms for several years, worsening. EXAM: MRI LUMBAR SPINE WITHOUT CONTRAST TECHNIQUE: Multiplanar, multisequence MR imaging of the lumbar spine was performed. No intravenous contrast was administered. COMPARISON:  None. FINDINGS: Segmentation:  Standard. Alignment:  Anatomic Vertebrae: No fracture, evidence of discitis, or bone lesion. Congenitally short pedicles. Conus medullaris and cauda equina: Conus extends to the L1 level. Cauda equina nerve roots are serpiginous below the L4-5 stenosis, described below. Paraspinal and other soft tissues: Unremarkable. Disc levels: T12-L1: Central and rightward extrusion. Mild stenosis. No conus compression. Mild subarticular zone narrowing could affect the RIGHT L1 nerve root. L1-L2:  Central protrusion.  Congenital stenosis.  No impingement. L2-L3: Normal disc space. Short pedicles. Advanced facet arthropathy and ligamentum flavum hypertrophy. Mild to moderate stenosis. BILATERAL L3 nerve root impingement. Borderline foraminal narrowing. L3-L4: Normal disc space. Short pedicles. Severe stenosis due to posterior element hypertrophy. BILATERAL L4 nerve root impingement. Borderline foraminal narrowing. L4-L5: Annular bulge. Severe, near critical stenosis. Posterior element hypertrophy. Short pedicles. BILATERAL L5 nerve root impingement. Significant compression of all cauda equina nerve roots. Borderline foraminal narrowing. L5-S1:  Normal disc space.  Mild facet arthropathy.  No impingement. IMPRESSION: Severe, near critical stenosis at L4-5, related to posterior element hypertrophy, short pedicles, and annular bulge. BILATERAL L5 nerve root impingement. Significant compression of all cauda equina nerve roots. Similar less severe changes at L2-3 and L3-4. Surgical consultation is warranted. Central and rightward extrusion at T12-L1, could  cause RIGHT-sided radicular symptoms but is  unlikely to could contribute to BILATERAL leg numbness. Electronically Signed   By: Staci Righter M.D.   On: 10/25/2017 15:57   Dg Lumbar Spine 1 View  Result Date: 11/21/2017 CLINICAL DATA:  For laminectomy and foraminotomy at L3-4 and L4-5 EXAM: LUMBAR SPINE - 1 VIEW COMPARISON:  MR lumbar spine of 10/25/2016 FINDINGS: The instrument positioned posteriorly is directed beneath the spinous process of L4, directed toward the L4-5 interspace. IMPRESSION: Localization of L4-5 interspace Electronically Signed   By: Ivar Drape M.D.   On: 11/21/2017 15:37    Antibiotics:  Anti-infectives (From admission, onward)   Start     Dose/Rate Route Frequency Ordered Stop   11/21/17 2300  ceFAZolin (ANCEF) IVPB 2g/100 mL premix     2 g 200 mL/hr over 30 Minutes Intravenous Every 8 hours 11/21/17 1843 11/22/17 0630   11/21/17 1546  bacitracin 50,000 Units in sodium chloride 0.9 % 500 mL irrigation  Status:  Discontinued       As needed 11/21/17 1546 11/21/17 1647   11/21/17 1430  ceFAZolin (ANCEF) IVPB 2g/100 mL premix     2 g 200 mL/hr over 30 Minutes Intravenous On call to O.R. 11/21/17 1224 11/21/17 1519   11/21/17 1227  ceFAZolin (ANCEF) 2-4 GM/100ML-% IVPB    Note to Pharmacy:  Debbe Bales, Meredit: cabinet override      11/21/17 1227 11/22/17 0029      Discharge Exam: Blood pressure (!) 144/85, pulse (!) 102, temperature 98.2 F (36.8 C), temperature source Oral, resp. rate 18, height 5\' 8"  (1.727 m), weight 90.3 kg (199 lb), SpO2 96 %. Neurologic: Grossly normal Incision CDI  Discharge Medications:   Allergies as of 11/22/2017   No Known Allergies     Medication List    TAKE these medications   BC FAST PAIN RELIEF PO Take 2 packets by mouth daily as needed (pain).   HYDROcodone-acetaminophen 7.5-325 MG tablet Commonly known as:  NORCO Take 1 tablet by mouth every 6 (six) hours.   metFORMIN 1000 MG tablet Commonly known as:  GLUCOPHAGE Take 1,000 mg by mouth daily.   naproxen  sodium 220 MG tablet Commonly known as:  ALEVE Take 440 mg by mouth daily as needed (pain).   omeprazole 20 MG capsule Commonly known as:  PRILOSEC Take 20 mg by mouth daily.   pioglitazone 30 MG tablet Commonly known as:  ACTOS Take 30 mg by mouth daily.   ramipril 2.5 MG capsule Commonly known as:  ALTACE Take 2.5 mg by mouth daily.   rosuvastatin 20 MG tablet Commonly known as:  CRESTOR Take 20 mg by mouth daily.   tamsulosin 0.4 MG Caps capsule Commonly known as:  FLOMAX Take 0.4 mg by mouth at bedtime.   TOUJEO SOLOSTAR 300 UNIT/ML Sopn Generic drug:  Insulin Glargine Inject 50 Units as directed at bedtime.       Disposition: home   Final Dx: LL for stenosis  Discharge Instructions     Remove dressing in 72 hours   Complete by:  As directed    Call MD for:  difficulty breathing, headache or visual disturbances   Complete by:  As directed    Call MD for:  persistant nausea and vomiting   Complete by:  As directed    Call MD for:  redness, tenderness, or signs of infection (pain, swelling, redness, odor or green/yellow discharge around incision site)   Complete by:  As directed  Call MD for:  severe uncontrolled pain   Complete by:  As directed    Call MD for:  temperature >100.4   Complete by:  As directed    Diet - low sodium heart healthy   Complete by:  As directed    Increase activity slowly   Complete by:  As directed          Signed: Demetrio Leighty S 11/22/2017, 10:07 AM

## 2017-11-22 NOTE — Discharge Instructions (Signed)
Wound Care Keep incision covered and dry for 3 days You may remove outer bandage after 3 days  Do not put any creams, lotions, or ointments on incision. Leave steri-strips on back.  They will fall off by themselves. Activity Walk each and every day, increasing distance each day. No lifting greater than 5 lbs.  Avoid excessive neck motion. No driving for 2 weeks; may ride as a passenger locally.  Diet Resume your normal diet.  Return to Work Will be discussed at you follow up appointment. Call Your Doctor If Any of These Occur Redness, drainage, or swelling at the wound.  Temperature greater than 101 degrees. Severe pain not relieved by pain medication. Increased difficulty swallowing.  Incision starts to come apart. Follow Up Appt Call today for appointment in 1-2 weeks ([phone]) or for problems.  If you have any hardware placed in your spine, you will need an x-ray before your appointment.

## 2017-12-04 ENCOUNTER — Encounter: Payer: Medicare Other | Admitting: Internal Medicine

## 2017-12-10 DIAGNOSIS — N401 Enlarged prostate with lower urinary tract symptoms: Secondary | ICD-10-CM | POA: Diagnosis not present

## 2017-12-10 DIAGNOSIS — R338 Other retention of urine: Secondary | ICD-10-CM | POA: Diagnosis not present

## 2017-12-12 DIAGNOSIS — N401 Enlarged prostate with lower urinary tract symptoms: Secondary | ICD-10-CM | POA: Diagnosis not present

## 2017-12-12 DIAGNOSIS — R338 Other retention of urine: Secondary | ICD-10-CM | POA: Diagnosis not present

## 2017-12-12 DIAGNOSIS — N1330 Unspecified hydronephrosis: Secondary | ICD-10-CM | POA: Diagnosis not present

## 2017-12-19 DIAGNOSIS — R338 Other retention of urine: Secondary | ICD-10-CM | POA: Diagnosis not present

## 2017-12-20 DIAGNOSIS — E1159 Type 2 diabetes mellitus with other circulatory complications: Secondary | ICD-10-CM | POA: Diagnosis not present

## 2017-12-31 ENCOUNTER — Emergency Department (HOSPITAL_BASED_OUTPATIENT_CLINIC_OR_DEPARTMENT_OTHER)
Admission: EM | Admit: 2017-12-31 | Discharge: 2017-12-31 | Disposition: A | Discharge status: Home / Self Care | Payer: Medicare Other | Attending: Emergency Medicine | Admitting: Emergency Medicine

## 2017-12-31 ENCOUNTER — Other Ambulatory Visit: Payer: Self-pay

## 2017-12-31 ENCOUNTER — Encounter (HOSPITAL_BASED_OUTPATIENT_CLINIC_OR_DEPARTMENT_OTHER): Payer: Self-pay | Admitting: *Deleted

## 2017-12-31 DIAGNOSIS — I10 Essential (primary) hypertension: Secondary | ICD-10-CM | POA: Diagnosis not present

## 2017-12-31 DIAGNOSIS — Z7984 Long term (current) use of oral hypoglycemic drugs: Secondary | ICD-10-CM | POA: Insufficient documentation

## 2017-12-31 DIAGNOSIS — Z79899 Other long term (current) drug therapy: Secondary | ICD-10-CM | POA: Diagnosis not present

## 2017-12-31 DIAGNOSIS — T83511A Infection and inflammatory reaction due to indwelling urethral catheter, initial encounter: Secondary | ICD-10-CM | POA: Insufficient documentation

## 2017-12-31 DIAGNOSIS — Y829 Unspecified medical devices associated with adverse incidents: Secondary | ICD-10-CM | POA: Diagnosis not present

## 2017-12-31 DIAGNOSIS — R339 Retention of urine, unspecified: Secondary | ICD-10-CM

## 2017-12-31 DIAGNOSIS — E785 Hyperlipidemia, unspecified: Secondary | ICD-10-CM | POA: Diagnosis not present

## 2017-12-31 DIAGNOSIS — E119 Type 2 diabetes mellitus without complications: Secondary | ICD-10-CM | POA: Diagnosis not present

## 2017-12-31 DIAGNOSIS — N390 Urinary tract infection, site not specified: Secondary | ICD-10-CM | POA: Diagnosis not present

## 2017-12-31 DIAGNOSIS — T83511D Infection and inflammatory reaction due to indwelling urethral catheter, subsequent encounter: Secondary | ICD-10-CM

## 2017-12-31 DIAGNOSIS — R338 Other retention of urine: Secondary | ICD-10-CM | POA: Diagnosis not present

## 2017-12-31 LAB — URINALYSIS, MICROSCOPIC (REFLEX): WBC, UA: >50 WBC/hpf (ref 0–5)

## 2017-12-31 LAB — URINALYSIS, ROUTINE W REFLEX MICROSCOPIC
BILIRUBIN URINE: NEGATIVE
KETONES UR: NEGATIVE mg/dL
Nitrite: POSITIVE — AB
Protein, ur: 100 mg/dL — AB
Specific Gravity, Urine: >1.03 — ABNORMAL HIGH (ref 1.005–1.030)
pH: 6 (ref 5.0–8.0)

## 2017-12-31 MED ORDER — CIPROFLOXACIN HCL 500 MG PO TABS: 500.0000 mg | ORAL_TABLET | Freq: Two times a day (BID) | ORAL | 0 refills | Status: AC

## 2017-12-31 NOTE — Discharge Instructions (Addendum)
Urine showed that you have a UTI. I have prescribed you antibiotics to take for the next 7 days. Please take the full course of antibiotics to ensure complete treatment.  Keep Foley in until you see your urologist in a couple of weeks.

## 2017-12-31 NOTE — ED Triage Notes (Signed)
Pt taken directly to room 1 for triage, reports he had foley removed this am and hasn't been able to void since. Pt states he called his urologist and was told to come to ed for foley replacement. Pt appears very uncomfortable.

## 2017-12-31 NOTE — ED Provider Notes (Signed)
[facility] EMERGENCY DEPARTMENT Provider Note  CSN: [id number] Arrival date & time: 12/31/17  0263    History   Chief Complaint Chief Complaint  Patient presents with  . Urinary Retention    HPI Zachary Vaughn is a 77 y.o. male with a medical history of Type 2 DM, HTN, HLD, BPH, DDD and spinal stenosis who presented to the ED for urinary retention x1 day. Patient had Foley catheter placed on 12/21/17 following spinal surgery. He went to his urologist this morning and had it removed. He has been unable to void on his own since the Foley was removed. Patient contacted the urologist who advised him to come to the ED for new Foley placement. Denies any other physical symptoms such as fever, chills, abdominal pain, N/V, change in bowel habits.  Past Medical History:  Diagnosis Date  . Actinic keratosis   . Allergic rhinitis    seasonal   . BPH (benign prostatic hyperplasia)   . DDD (degenerative disc disease), lumbar   . Diverticulosis   . DM (diabetes mellitus) ([place])   . Erectile dysfunction   . GERD (gastroesophageal reflux disease)   . History of kidney stones    LAST FLARE UP 08/2017  . History of shingles 2001  . Hyperlipidemia   . Hypertension   . Kidney stone   . Osteoporosis   . Polyp of colon, adenomatous 7858,8502  . Spinal stenosis   . Spondylosis     Patient Active Problem List   Diagnosis Date Noted  . S/P lumbar laminectomy 11/21/2017  . Personal history of colonic adenomas 07/01/2012    Past Surgical History:  Procedure Laterality Date  . APPENDECTOMY    . COLONOSCOPY    . FRACTURE SURGERY     ROD DOWN IN LEFT LEG  . LUMBAR LAMINECTOMY/DECOMPRESSION MICRODISCECTOMY Bilateral 11/21/2017   Procedure: Laminectomy and Foraminotomy - Lumbar Three-Lumbar Four - Lumbar Four-Lumbar Five - bilateral;  Surgeon: Eustace Moore, MD;  Location: [facility];  Service: Neurosurgery;  Laterality: Bilateral;  Laminectomy and Foraminotomy - Lumbar Three-Lumbar Four -  Lumbar Four-Lumbar Five - bilateral  . tib/fib fx  10/2008   left leg, rod put in        Home Medications    Prior to Admission medications   Medication Sig Start Date End Date Taking? Authorizing Provider  Aspirin-Caffeine (BC FAST PAIN RELIEF PO) Take 2 packets by mouth daily as needed (pain).    [provider]  ciprofloxacin (CIPRO) 500 MG tablet Take 1 tablet (500 mg total) by mouth 2 (two) times daily for 7 days. 12/31/17 01/07/18  Mortis, Jonelle Sports, PA-C  HYDROcodone-acetaminophen (NORCO) 7.5-325 MG tablet Take 1 tablet by mouth every 6 (six) hours. 11/22/17   Eustace Moore, MD  metFORMIN (GLUCOPHAGE) 1000 MG tablet Take 1,000 mg by mouth daily. 10/14/17   [provider]  naproxen sodium (ALEVE) 220 MG tablet Take 440 mg by mouth daily as needed (pain).    [provider]  omeprazole (PRILOSEC) 20 MG capsule Take 20 mg by mouth daily.    [provider]  pioglitazone (ACTOS) 30 MG tablet Take 30 mg by mouth daily.    [provider]  ramipril (ALTACE) 2.5 MG capsule Take 2.5 mg by mouth daily.    [provider]  rosuvastatin (CRESTOR) 20 MG tablet Take 20 mg by mouth daily.     [provider]  tamsulosin (FLOMAX) 0.4 MG CAPS capsule Take 0.4 mg by mouth  at bedtime.    [provider]  TOUJEO SOLOSTAR 300 UNIT/ML SOPN Inject 50 Units as directed at bedtime. 09/19/17   [provider]    Family History Family History  Problem Relation Age of Onset  . Pulmonary fibrosis Mother   . Pancreatic cancer Sister        intraabdominal tumor  . Liver disease Father   . Alcoholism Father     Social History Social History   Tobacco Use  . Smoking status: Never Smoker  . Smokeless tobacco: Never Used  Substance Use Topics  . Alcohol use: No  . Drug use: No     Allergies   Patient has no known allergies.   Review of Systems Review of Systems  Constitutional: Negative for chills and fever.    Gastrointestinal: Negative for abdominal pain, constipation, diarrhea, nausea and vomiting.  Genitourinary: Positive for decreased urine volume, difficulty urinating and penile pain. Negative for discharge, flank pain, frequency, hematuria, penile swelling, scrotal swelling, testicular pain and urgency.  Musculoskeletal: Negative.   Skin: Negative.      Physical Exam Updated Vital Signs BP (!) 172/97 (BP Location: Right Arm)   Pulse (!) 116   Temp 99.1 F (37.3 C) (Oral)   Resp 18   Ht 5\' 8"  (1.727 m)   Wt 89.4 kg (197 lb)   SpO2 97%   BMI 29.95 kg/m   Physical Exam  Constitutional: He appears well-developed and well-nourished. No distress.  Cardiovascular: Normal rate, regular rhythm, normal heart sounds and intact distal pulses.  Pulmonary/Chest: Effort normal and breath sounds normal.  Abdominal: Bowel sounds are normal. He exhibits distension. There is no tenderness.  Genitourinary: Testes normal. Uncircumcised. Penile tenderness present. No penile erythema. No discharge found.     Nursing note and vitals reviewed.    ED Treatments / Results  Labs (all labs ordered are listed, but only abnormal results are displayed) Labs Reviewed  URINALYSIS, ROUTINE W REFLEX MICROSCOPIC - Abnormal; Notable for the following components:      Result Value   APPearance CLOUDY (*)    Specific Gravity, Urine >1.030 (*)    Glucose, UA >=500 (*)    Hgb urine dipstick LARGE (*)    Protein, ur 100 (*)    Nitrite POSITIVE (*)    Leukocytes, UA MODERATE (*)    All other components within normal limits  URINALYSIS, MICROSCOPIC (REFLEX) - Abnormal; Notable for the following components:   Bacteria, UA MANY (*)    All other components within normal limits  URINE CULTURE    EKG None  Radiology No results found.  Procedures Procedures (including critical care time)  Medications Ordered in ED Medications - No data to display   Initial Impression / Assessment and Plan / ED  Course  Triage vital signs and the nursing notes have been reviewed.  Pertinent labs & imaging results that were available during care of the patient were reviewed and considered in medical decision making (see chart for details).  Patient presents afebrile and is well appearing. Other than discomfort from bladder distention, patient has no physical complaints.   Clinical Course as of Dec 31 1945  Tue Dec 31, 2017  1904 Foley cath successfully placed without issue. Urine in bag has obvious cloudy, dark appearance. Patient states when Foley was removed this AM in the urologist office that it looked like normal urine.   [GM]  1913 UA consistent with UTI. Urine culture sent. Will prescribe PO antibiotics for treatment. Cipro  chosen for his coverage given recent catherization.   [GM]    Clinical Course User Index [GM] Mortis, Jonelle Sports, PA-C   Final Clinical Impressions(s) / ED Diagnoses  1. Urinary Retention. Foley cath placed in ED. Patient spoke with urologist and has appointment scheduled for 01/22/18 and was instructed to keep Foley in until then. Education provided on appropriate cleaning and care with Foley. 2. UTI. Catheter associated. Cipro 500mg  BID x7 days prescribed. Urine culture sent.  Dispo: Home. After thorough clinical evaluation, this patient is determined to be medically stable and can be safely discharged with the previously mentioned treatment and/or outpatient follow-up/referral(s). At this time, there are no other apparent medical conditions that require further screening, evaluation or treatment.   Final diagnoses:  Urinary retention  Urinary tract infection associated with indwelling urethral catheter, subsequent encounter    ED Discharge Orders        Ordered    ciprofloxacin (CIPRO) 500 MG tablet  2 times daily     12/31/17 1947        MortisHoover Brunette 12/31/17 Sheridan, Dan, DO 12/31/17 2318

## 2018-01-03 LAB — URINE CULTURE: Culture: >=100000 — AB

## 2018-01-04 ENCOUNTER — Telehealth: Payer: Self-pay

## 2018-01-04 NOTE — Telephone Encounter (Signed)
Post ED Visit - Positive Culture Follow-up  Culture report reviewed by antimicrobial stewardship pharmacist:  []  Elenor Quinones, Pharm.D. []  Heide Guile, Pharm.D., BCPS AQ-ID [x]  Parks Neptune, Pharm.D., BCPS []  Alycia Rossetti, Pharm.D., BCPS []  Mentor, Pharm.D., BCPS, AAHIVP []  Legrand Como, Pharm.D., BCPS, AAHIVP []  Salome Arnt, PharmD, BCPS []  Johnnette Gourd, PharmD, BCPS []  Hughes Better, PharmD, BCPS []  Leeroy Cha, PharmD  Positive urine culture Treated with Ciprofloxacin, organism sensitive to the same and no further patient follow-up is required at this time.  Genia Del 01/04/2018, 10:18 AM

## 2018-01-15 ENCOUNTER — Encounter: Payer: Self-pay | Admitting: Internal Medicine

## 2018-01-15 DIAGNOSIS — R338 Other retention of urine: Secondary | ICD-10-CM | POA: Diagnosis not present

## 2018-01-15 DIAGNOSIS — N401 Enlarged prostate with lower urinary tract symptoms: Secondary | ICD-10-CM | POA: Diagnosis not present

## 2018-01-21 ENCOUNTER — Other Ambulatory Visit: Payer: Self-pay | Admitting: Urology

## 2018-02-04 NOTE — Patient Instructions (Addendum)
Zachary Vaughn  02/04/2018   Your procedure is scheduled on: 02-14-18   Report to [facility] Main  Entrance    Report to Admitting at 5:30 AM    Call this number if you have problems the morning of surgery [phone]   Remember: Do not eat food or drink liquids :After Midnight.     Take these medicines the morning of surgery with A SIP OF WATER: Omeprazole (Prilosec)   DO NOT TAKE ANY DIABETIC MEDICATIONS DAY OF YOUR SURGERY                               You may not have any metal on your body including hair pins and              piercings  Do not wear jewelry, lotions, powders, cologne or deodorant             Men may shave face and neck.   Do not bring valuables to the hospital. [place].  Contacts, dentures or bridgework may not be worn into surgery.      Patients discharged the day of surgery will not be allowed to drive home.  Name and phone number of your driver: [name] [phone]  Special Instructions: N/A              Please read over the following fact sheets you were given: _____________________________________________________________________             How to Manage Your Diabetes Before and After Surgery  Why is it important to control my blood sugar before and after surgery? . Improving blood sugar levels before and after surgery helps healing and can limit problems. . A way of improving blood sugar control is eating a healthy diet by: o  Eating less sugar and carbohydrates o  Increasing activity/exercise o  Talking with your doctor about reaching your blood sugar goals . High blood sugars (greater than 180 mg/dL) can raise your risk of infections and slow your recovery, so you will need to focus on controlling your diabetes during the weeks before surgery. . Make sure that the doctor who takes care of your diabetes knows about your planned surgery including the date and  location.  How do I manage my blood sugar before surgery? . Check your blood sugar at least 4 times a day, starting 2 days before surgery, to make sure that the level is not too high or low. o Check your blood sugar the morning of your surgery when you wake up and every 2 hours until you get to the Short Stay unit. . If your blood sugar is less than 70 mg/dL, you will need to treat for low blood sugar: o Do not take insulin. o Treat a low blood sugar (less than 70 mg/dL) with  cup of clear juice (cranberry or apple), 4 glucose tablets, OR glucose gel. o Recheck blood sugar in 15 minutes after treatment (to make sure it is greater than 70 mg/dL). If your blood sugar is not greater than 70 mg/dL on recheck, call [phone] for further instructions. . Report your blood sugar to the short stay nurse when you get to Short Stay.  . If you are admitted to  the hospital after surgery: o Your blood sugar will be checked by the staff and you will probably be given insulin after surgery (instead of oral diabetes medicines) to make sure you have good blood sugar levels. o The goal for blood sugar control after surgery is 80-180 mg/dL.   WHAT DO I DO ABOUT MY DIABETES MEDICATION?  Marland Kitchen Do not take oral diabetes medicines (pills) the morning of surgery.  . THE DAY BEFORE SURGERY, take your usual dose of Metformin, and Actos. Take only 25 units of Toujeo Insulin at bedtime.         Reviewed and Endorsed by [facility] Patient Education Committee, August 2015   [facility] - Preparing for Surgery Before surgery, you can play an important role.  Because skin is not sterile, your skin needs to be as free of germs as possible.  You can reduce the number of germs on your skin by washing with CHG (chlorahexidine gluconate) soap before surgery.  CHG is an antiseptic cleaner which kills germs and bonds with the skin to continue killing germs even after washing. Please DO NOT use if you have an allergy to CHG  or antibacterial soaps.  If your skin becomes reddened/irritated stop using the CHG and inform your nurse when you arrive at Short Stay. Do not shave (including legs and underarms) for at least 48 hours prior to the first CHG shower.  You may shave your face/neck. Please follow these instructions carefully:  1.  Shower with CHG Soap the night before surgery and the  morning of Surgery.  2.  If you choose to wash your hair, wash your hair first as usual with your  normal  shampoo.  3.  After you shampoo, rinse your hair and body thoroughly to remove the  shampoo.                           4.  Use CHG as you would any other liquid soap.  You can apply chg directly  to the skin and wash                       Gently with a scrungie or clean washcloth.  5.  Apply the CHG Soap to your body ONLY FROM THE NECK DOWN.   Do not use on face/ open                           Wound or open sores. Avoid contact with eyes, ears mouth and genitals (private parts).                       Wash face,  Genitals (private parts) with your normal soap.             6.  Wash thoroughly, paying special attention to the area where your surgery  will be performed.  7.  Thoroughly rinse your body with warm water from the neck down.  8.  DO NOT shower/wash with your normal soap after using and rinsing off  the CHG Soap.                9.  Pat yourself dry with a clean towel.            10.  Wear clean pajamas.            11.  Place clean sheets on  your bed the night of your first shower and do not  sleep with pets. Day of Surgery : Do not apply any lotions/deodorants the morning of surgery.  Please wear clean clothes to the hospital/surgery center.  FAILURE TO FOLLOW THESE INSTRUCTIONS MAY RESULT IN THE CANCELLATION OF YOUR SURGERY PATIENT SIGNATURE_________________________________  NURSE SIGNATURE__________________________________  ________________________________________________________________________

## 2018-02-04 NOTE — Progress Notes (Signed)
11-14-17 (Epic) EKG, CXR

## 2018-02-06 ENCOUNTER — Other Ambulatory Visit: Payer: Self-pay

## 2018-02-06 ENCOUNTER — Encounter (HOSPITAL_COMMUNITY)
Admission: RE | Admit: 2018-02-06 | Discharge: 2018-02-06 | Disposition: A | Discharge status: Home / Self Care | Payer: Medicare Other | Source: Ambulatory Visit | Attending: Urology | Admitting: Urology

## 2018-02-06 ENCOUNTER — Encounter (HOSPITAL_COMMUNITY): Payer: Self-pay

## 2018-02-06 DIAGNOSIS — N210 Calculus in bladder: Secondary | ICD-10-CM | POA: Insufficient documentation

## 2018-02-06 DIAGNOSIS — N401 Enlarged prostate with lower urinary tract symptoms: Secondary | ICD-10-CM | POA: Insufficient documentation

## 2018-02-06 DIAGNOSIS — Z01812 Encounter for preprocedural laboratory examination: Secondary | ICD-10-CM | POA: Insufficient documentation

## 2018-02-06 DIAGNOSIS — R3912 Poor urinary stream: Secondary | ICD-10-CM | POA: Diagnosis not present

## 2018-02-06 HISTORY — DX: Adverse effect of unspecified anesthetic, initial encounter: T41.45XA

## 2018-02-06 HISTORY — DX: Other complications of anesthesia, initial encounter: T88.59XA

## 2018-02-06 LAB — CBC
HCT: 40.9 % (ref 39.0–52.0)
HEMOGLOBIN: 13.3 g/dL (ref 13.0–17.0)
MCH: 28.6 pg (ref 26.0–34.0)
MCHC: 32.5 g/dL (ref 30.0–36.0)
MCV: 88 fL (ref 78.0–100.0)
PLATELETS: 183 10*3/uL (ref 150–400)
RBC: 4.65 MIL/uL (ref 4.22–5.81)
RDW: 13.8 % (ref 11.5–15.5)
WBC: 6.1 10*3/uL (ref 4.0–10.5)

## 2018-02-06 LAB — BASIC METABOLIC PANEL
ANION GAP: 9 (ref 5–15)
BUN: 15 mg/dL (ref 8–23)
CALCIUM: 8.7 mg/dL — AB (ref 8.9–10.3)
CO2: 26 mmol/L (ref 22–32)
CREATININE: 1.02 mg/dL (ref 0.61–1.24)
Chloride: 107 mmol/L (ref 98–111)
GLUCOSE: 208 mg/dL — AB (ref 70–99)
Potassium: 3.6 mmol/L (ref 3.5–5.1)
Sodium: 142 mmol/L (ref 135–145)

## 2018-02-06 LAB — HEMOGLOBIN A1C
HEMOGLOBIN A1C: 7.3 % — AB (ref 4.8–5.6)
Mean Plasma Glucose: 162.81 mg/dL

## 2018-02-06 LAB — GLUCOSE, CAPILLARY: GLUCOSE-CAPILLARY: 199 mg/dL — AB (ref 70–99)

## 2018-02-13 NOTE — Anesthesia Preprocedure Evaluation (Signed)
Anesthesia Evaluation  Patient identified by MRN, date of birth, ID band Patient awake    Reviewed: Allergy & Precautions, NPO status , Patient's Chart, lab work & pertinent test results  History of Anesthesia Complications (+) history of anesthetic complications  Airway Mallampati: II  TM Distance: >3 FB     Dental   Pulmonary neg pulmonary ROS,    breath sounds clear to auscultation       Cardiovascular hypertension, negative cardio ROS   Rhythm:Regular Rate:Normal     Neuro/Psych    GI/Hepatic GERD  ,  Endo/Other  diabetes  Renal/GU Renal disease     Musculoskeletal  (+) Arthritis ,   Abdominal   Peds  Hematology   Anesthesia Other Findings   Reproductive/Obstetrics                             Anesthesia Physical  Anesthesia Plan  ASA: III  Anesthesia Plan: General   Post-op Pain Management:    Induction: Intravenous  PONV Risk Score and Plan: 2 and Treatment may vary due to age or medical condition  Airway Management Planned: LMA  Additional Equipment: None  Intra-op Plan:   Post-operative Plan: Extubation in OR  Informed Consent: I have reviewed the patients History and Physical, chart, labs and discussed the procedure including the risks, benefits and alternatives for the proposed anesthesia with the patient or authorized representative who has indicated his/her understanding and acceptance.   Dental advisory given  Plan Discussed with: CRNA  Anesthesia Plan Comments:         Anesthesia Quick Evaluation

## 2018-02-13 NOTE — H&P (Signed)
Office Visit Report     01/15/2018   --------------------------------------------------------------------------------   Zachary Vaughn  MRN: [id number]  PRIMARY CARE:  Zachary Vaughn. Perini, MD  DOB: 24-Jun-1941, 77 year old Male  REFERRING:  Zachary A. Joylene Draft, MD  SSN: -[id number]  PROVIDER:  Festus Aloe, M.D.    LOCATION:  [facility] [phone]   --------------------------------------------------------------------------------   CC: I have urinary retention.  HPI: Zachary Vaughn is a 77 year-old male established patient who is here for urinary retention.  His problem was diagnosed 12/10/2017. His current symptoms did begin after he had a surgical procedure. His urinary retention is being treated with foley catheter. Patient denies suprapubic tube, intemittent catheterization, flomax, hytrin, cardura, uroxatrol, rapaflo, avodart, and proscar.   His postvoid was 806 mL June 2019. He underwent back surgery Nov 21, 2017. Difficult voiding since. He has PN. He was constipated and opened up yesterday. AUASS = 30. UA clear. Foley placed, Failed void trial x 2-3. H/o BPH and retention. Finasteride added to tams June 2019.   Here for cysto. Catheter placed in the emergency 12/31/2017. Treated with Cipro for Escherichia coli positive urine culture. Sens to NF and cephalexin too.     ALLERGIES: Peanuts    MEDICATIONS: Finasteride 5 mg tablet 1 tablet PO Daily  Metformin Hcl 1,000 mg tablet  Omeprazole 20 mg capsule,delayed release  Tamsulosin Hcl 0.4 mg capsule 1 capsule PO BID  Carisoprodol 350 mg tablet  Crestor 10 mg tablet Oral  Ramipril 2.5 mg capsule Oral  Tamsulosin HCl - 0.4 MG Oral Capsule 0 Oral  Toujeo Solostar     GU PSH: Catheterize For Residual - 12/10/2017    NON-GU PSH: Appendectomy - 2014 Back surgery    GU PMH: BPH w/LUTS - 12/12/2017, - 12/10/2017, - 08/15/2016, BPH (benign prostatic hypertrophy) with urinary retention, - 2017 Urinary Retention - 12/12/2017, -  12/10/2017 Nocturia, Nocturia - 2017 Urinary Retention, Unspec, Incomplete bladder emptying - 2016, Urinary retention, - 2016    NON-GU PMH: Encounter for general adult medical examination without abnormal findings, Encounter for preventive health examination - 2015 Personal history of other endocrine, nutritional and metabolic disease, History of hypercholesterolemia - 2014, History of diabetes mellitus, - 2014    FAMILY HISTORY: No pertinent family history - Runs In Family   SOCIAL HISTORY: Marital Status: Single Preferred Language: English; Race: White Current Smoking Status: Patient has never smoked.   Tobacco Use Assessment Completed: Used Tobacco in last 30 days? Has never drank.  Drinks 3 caffeinated drinks per day. Patient's occupation is/was Retired.     Notes: Two children   REVIEW OF SYSTEMS:    GU Review Male:   Patient denies frequent urination, hard to postpone urination, burning/ pain with urination, get up at night to urinate, leakage of urine, stream starts and stops, trouble starting your stream, have to strain to urinate , erection problems, and penile pain.  Gastrointestinal (Upper):   Patient denies nausea, vomiting, and indigestion/ heartburn.  Gastrointestinal (Lower):   Patient denies diarrhea and constipation.  Constitutional:   Patient denies fever, night sweats, weight loss, and fatigue.  Skin:   Patient reports skin rash/ lesion and itching.   Eyes:   Patient denies blurred vision and double vision.  Ears/ Nose/ Throat:   Patient denies sore throat and sinus problems.  Hematologic/Lymphatic:   Patient reports easy bruising. Patient denies swollen glands.  Cardiovascular:   Patient denies leg swelling and chest pains.  Respiratory:   Patient denies  cough and shortness of breath.  Endocrine:   Patient denies excessive thirst.  Musculoskeletal:   Patient reports joint pain. Patient denies back pain.  Neurological:   Patient denies headaches and dizziness.   Psychologic:   Patient denies depression and anxiety.   Notes: cath     VITAL SIGNS:      01/15/2018 12:43 PM  Weight 200 lb / 90.72 kg  Height 68 in / 172.72 cm  BP 159/73 mmHg  Heart Rate 112 /min  Temperature 97.1 F / 36.1 C  BMI 30.4 kg/m   GU PHYSICAL EXAMINATION:    Urethral Meatus: Normal size. No lesion, no wart, no polyp, no balanitis, no discharge. Normal location.   Penis: Circumcised, no foreskin warts, no cracks. No dorsal peyronie's plaques, no left corporal peyronie's plaques, no right corporal peyronie's plaques, no scarring, no shaft warts. No balanitis, no meatal stenosis.    MULTI-SYSTEM PHYSICAL EXAMINATION:    Constitutional: Well-nourished. No physical deformities. Normally developed. Good grooming.  Neck: Neck symmetrical, not swollen. Normal tracheal position.  Respiratory: No labored breathing, no use of accessory muscles. Normal breath sounds.  Cardiovascular: Regular rate and rhythm. No murmur, no gallop. Normal temperature, normal extremity pulses, no swelling, no varicosities.  Skin: No paleness, no jaundice, no cyanosis. No lesion, no ulcer, no rash.  Neurologic / Psychiatric: Oriented to time, oriented to place, oriented to person. No depression, no anxiety, no agitation.  Gastrointestinal: No mass, no tenderness, no rigidity, non obese abdomen.     PAST DATA REVIEWED:  Source Of History:  Patient   PROCEDURES:         Flexible Cystoscopy - 52000  Risks, benefits, and some of the potential complications of the procedure were discussed with the patient. All questions were answered. Informed consent was obtained. Antibiotic prophylaxis was given -- Cephalexin. Sterile technique and intraurethral analgesia were used.  Meatus:  Normal size. Normal location. Normal condition.  Urethra:  No strictures.  External Sphincter:  Normal.  Verumontanum:  Normal.  Prostate:  Obstructing. Moderate hyperplasia.  Bladder Neck:  Non-obstructing.  Ureteral  Orifices:  Normal location. Normal size. Normal shape. Effluxed clear urine.  Bladder:  Moderate trabeculation. A few small stones. No tumors. Normal mucosa.      The lower urinary tract was carefully examined. The procedure was well-tolerated and without complications. Antibiotic instructions were given. He was filled to 275 ml and felt like he needed to void. He voided 150 mL with a decent stream. I gave him an additional cefalexin to take in 6 hours. Instructions were given to call the office immediately for bloody urine, difficulty urinating, painful urination, fever, chills, nausea, vomiting or other illness. The patient stated that he understood these instructions and would comply with them.   ASSESSMENT:      ICD-10 Details  1 GU:   Urinary Retention - R33.8   2   BPH w/LUTS - N40.1    PLAN:           Schedule Return Visit/Planned Activity: Next Available Appointment - Schedule Surgery          Document Letter(s):  Created for Patient: Clinical Summary         Notes:   Urinary retention-discussed continued surveillance, Foley or CIC. Discussed urodynamics.   BPH-discussed the nature risk and benefits of thulium laser vaporization of the prostate including side effects of the procedure, expected post-op course and likelihood of success. We discussed flow symptoms and irritative symptoms typically improve, but  frequency and urgency can persist and rarely worsen. We also discussed risk of bleeding, infection, stricture, sexual dysfunction and incontinence among others. All questions answered. We discussed continued medical management. He elects to proceed with surgery.   Bladder stones-discuss laser cystolitholapaxy.   Cc: Dr. Joylene Vaughn    * Signed by Festus Aloe, M.D. on 01/15/18 at 5:12 PM (EDT)*     The information contained in this medical record document is considered private and confidential patient information. This information can only be used for the medical diagnosis  and/or medical services that are being provided by the patient's selected caregivers. This information can only be distributed outside of the patient's care if the patient agrees and signs waivers of authorization for this information to be sent to an outside source or route.

## 2018-02-14 ENCOUNTER — Ambulatory Visit (HOSPITAL_COMMUNITY): Payer: Medicare Other | Admitting: Certified Registered Nurse Anesthetist

## 2018-02-14 ENCOUNTER — Encounter (HOSPITAL_COMMUNITY): Payer: Self-pay | Admitting: Emergency Medicine

## 2018-02-14 ENCOUNTER — Ambulatory Visit (HOSPITAL_COMMUNITY)
Admission: RE | Admit: 2018-02-14 | Discharge: 2018-02-14 | Disposition: A | Discharge status: Home / Self Care | Payer: Medicare Other | Source: Other Acute Inpatient Hospital | Attending: Urology | Admitting: Urology

## 2018-02-14 ENCOUNTER — Encounter (HOSPITAL_COMMUNITY)
Admission: RE | Disposition: A | Discharge status: Home / Self Care | Payer: Self-pay | Source: Other Acute Inpatient Hospital | Attending: Urology

## 2018-02-14 DIAGNOSIS — M1990 Unspecified osteoarthritis, unspecified site: Secondary | ICD-10-CM | POA: Insufficient documentation

## 2018-02-14 DIAGNOSIS — E119 Type 2 diabetes mellitus without complications: Secondary | ICD-10-CM | POA: Insufficient documentation

## 2018-02-14 DIAGNOSIS — R3912 Poor urinary stream: Secondary | ICD-10-CM | POA: Diagnosis not present

## 2018-02-14 DIAGNOSIS — R531 Weakness: Secondary | ICD-10-CM | POA: Insufficient documentation

## 2018-02-14 DIAGNOSIS — Z794 Long term (current) use of insulin: Secondary | ICD-10-CM | POA: Diagnosis not present

## 2018-02-14 DIAGNOSIS — N210 Calculus in bladder: Secondary | ICD-10-CM

## 2018-02-14 DIAGNOSIS — Z79899 Other long term (current) drug therapy: Secondary | ICD-10-CM | POA: Insufficient documentation

## 2018-02-14 DIAGNOSIS — E7800 Pure hypercholesterolemia, unspecified: Secondary | ICD-10-CM | POA: Insufficient documentation

## 2018-02-14 DIAGNOSIS — N401 Enlarged prostate with lower urinary tract symptoms: Secondary | ICD-10-CM | POA: Diagnosis not present

## 2018-02-14 DIAGNOSIS — R339 Retention of urine, unspecified: Secondary | ICD-10-CM | POA: Diagnosis not present

## 2018-02-14 DIAGNOSIS — I10 Essential (primary) hypertension: Secondary | ICD-10-CM | POA: Diagnosis not present

## 2018-02-14 DIAGNOSIS — N138 Other obstructive and reflux uropathy: Secondary | ICD-10-CM

## 2018-02-14 DIAGNOSIS — Z91010 Allergy to peanuts: Secondary | ICD-10-CM | POA: Insufficient documentation

## 2018-02-14 DIAGNOSIS — K219 Gastro-esophageal reflux disease without esophagitis: Secondary | ICD-10-CM | POA: Insufficient documentation

## 2018-02-14 DIAGNOSIS — N400 Enlarged prostate without lower urinary tract symptoms: Secondary | ICD-10-CM | POA: Diagnosis not present

## 2018-02-14 HISTORY — PX: CYSTOSCOPY WITH LITHOLAPAXY: SHX1425

## 2018-02-14 HISTORY — PX: THULIUM LASER TURP (TRANSURETHRAL RESECTION OF PROSTATE): SHX6744

## 2018-02-14 LAB — GLUCOSE, CAPILLARY
GLUCOSE-CAPILLARY: 196 mg/dL — AB (ref 70–99)
Glucose-Capillary: 141 mg/dL — ABNORMAL HIGH (ref 70–99)

## 2018-02-14 SURGERY — CYSTOSCOPY, WITH BLADDER CALCULUS LITHOLAPAXY
Anesthesia: General

## 2018-02-14 MED ORDER — PROPOFOL 10 MG/ML IV BOLUS
INTRAVENOUS | Status: AC
Start: 2018-02-14 — End: ?
  Filled 2018-02-14: qty 20

## 2018-02-14 MED ORDER — FENTANYL CITRATE (PF) 100 MCG/2ML IJ SOLN
25.0000 ug | INTRAMUSCULAR | Status: DC | PRN
  Administered 2018-02-14 (×2): 50 ug via INTRAVENOUS

## 2018-02-14 MED ORDER — FENTANYL CITRATE (PF) 100 MCG/2ML IJ SOLN
INTRAMUSCULAR | Status: AC
  Filled 2018-02-14: qty 2

## 2018-02-14 MED ORDER — PROPOFOL 10 MG/ML IV BOLUS
INTRAVENOUS | Status: DC | PRN
  Administered 2018-02-14: 50 mg via INTRAVENOUS
  Administered 2018-02-14: 160 mg via INTRAVENOUS

## 2018-02-14 MED ORDER — ONDANSETRON HCL 4 MG/2ML IJ SOLN
INTRAMUSCULAR | Status: DC | PRN
  Administered 2018-02-14: 4 mg via INTRAVENOUS

## 2018-02-14 MED ORDER — MEPERIDINE HCL 50 MG/ML IJ SOLN: 6.2500 mg | INTRAMUSCULAR | Status: DC | PRN

## 2018-02-14 MED ORDER — FENTANYL CITRATE (PF) 100 MCG/2ML IJ SOLN
INTRAMUSCULAR | Status: DC
Start: 2018-02-14 — End: 2018-02-14
  Filled 2018-02-14: qty 2

## 2018-02-14 MED ORDER — PHENYLEPHRINE 40 MCG/ML (10ML) SYRINGE FOR IV PUSH (FOR BLOOD PRESSURE SUPPORT)
PREFILLED_SYRINGE | INTRAVENOUS | Status: AC
  Filled 2018-02-14: qty 20

## 2018-02-14 MED ORDER — SODIUM CHLORIDE 0.9 % IR SOLN
Status: DC | PRN
  Administered 2018-02-14: 30000 mL

## 2018-02-14 MED ORDER — LIDOCAINE 2% (20 MG/ML) 5 ML SYRINGE
INTRAMUSCULAR | Status: AC
  Filled 2018-02-14: qty 5

## 2018-02-14 MED ORDER — 0.9 % SODIUM CHLORIDE (POUR BTL) OPTIME
TOPICAL | Status: DC | PRN
  Administered 2018-02-14: 1000 mL

## 2018-02-14 MED ORDER — STERILE WATER FOR IRRIGATION IR SOLN
Status: DC | PRN
  Administered 2018-02-14: 500 mL

## 2018-02-14 MED ORDER — FENTANYL CITRATE (PF) 100 MCG/2ML IJ SOLN
INTRAMUSCULAR | Status: DC | PRN
  Administered 2018-02-14 (×4): 50 ug via INTRAVENOUS

## 2018-02-14 MED ORDER — LACTATED RINGERS IV SOLN
INTRAVENOUS | Status: DC
  Administered 2018-02-14 (×2): via INTRAVENOUS

## 2018-02-14 MED ORDER — PROPOFOL 10 MG/ML IV BOLUS
INTRAVENOUS | Status: AC
  Filled 2018-02-14: qty 20

## 2018-02-14 MED ORDER — CEPHALEXIN 500 MG PO CAPS: 500.0000 mg | ORAL_CAPSULE | Freq: Every day | ORAL | 0 refills | Status: AC

## 2018-02-14 MED ORDER — CEFAZOLIN SODIUM-DEXTROSE 2-4 GM/100ML-% IV SOLN
2.0000 g | Freq: Once | INTRAVENOUS | Status: AC
  Administered 2018-02-14: 2 g via INTRAVENOUS
  Filled 2018-02-14: qty 100

## 2018-02-14 MED ORDER — ONDANSETRON HCL 4 MG/2ML IJ SOLN
INTRAMUSCULAR | Status: AC
  Filled 2018-02-14: qty 2

## 2018-02-14 MED ORDER — LIDOCAINE 2% (20 MG/ML) 5 ML SYRINGE
INTRAMUSCULAR | Status: DC | PRN
  Administered 2018-02-14: 100 mg via INTRAVENOUS

## 2018-02-14 MED ORDER — PROMETHAZINE HCL 25 MG/ML IJ SOLN: 6.2500 mg | INTRAMUSCULAR | Status: DC | PRN

## 2018-02-14 MED ORDER — TAMSULOSIN HCL 0.4 MG PO CAPS: 0.4000 mg | ORAL_CAPSULE | Freq: Every day | ORAL | Status: DC

## 2018-02-14 MED ORDER — PHENYLEPHRINE 40 MCG/ML (10ML) SYRINGE FOR IV PUSH (FOR BLOOD PRESSURE SUPPORT)
PREFILLED_SYRINGE | INTRAVENOUS | Status: DC | PRN
  Administered 2018-02-14: 80 ug via INTRAVENOUS
  Administered 2018-02-14: 120 ug via INTRAVENOUS
  Administered 2018-02-14 (×2): 80 ug via INTRAVENOUS
  Administered 2018-02-14: 120 ug via INTRAVENOUS

## 2018-02-14 SURGICAL SUPPLY — 13 items
BAG URINE DRAINAGE (UROLOGICAL SUPPLIES) ×2 IMPLANT
BAG URO CATCHER STRL LF (MISCELLANEOUS) ×2 IMPLANT
CATH TIEMANN FOLEY 18FR 5CC (CATHETERS) ×2 IMPLANT
CLOTH BEACON ORANGE TIMEOUT ST (SAFETY) ×2 IMPLANT
FIBER LASER FLEXIVA 1000 (UROLOGICAL SUPPLIES) ×2 IMPLANT
GLOVE BIOGEL M STRL SZ7.5 (GLOVE) ×2 IMPLANT
GOWN STRL REUS W/TWL LRG LVL3 (GOWN DISPOSABLE) ×2 IMPLANT
GOWN STRL REUS W/TWL XL LVL3 (GOWN DISPOSABLE) ×2 IMPLANT
LASER REVOLIX PROCEDURE (MISCELLANEOUS) ×2 IMPLANT
MANIFOLD NEPTUNE II (INSTRUMENTS) ×2 IMPLANT
PACK CYSTO (CUSTOM PROCEDURE TRAY) ×2 IMPLANT
SYRINGE IRR TOOMEY STRL 70CC (SYRINGE) ×2 IMPLANT
TUBING CONNECTING 10 (TUBING) ×2 IMPLANT

## 2018-02-14 NOTE — Transfer of Care (Signed)
Immediate Anesthesia Transfer of Care Note  Patient: Zachary Vaughn  Procedure(s) Performed: CYSTOSCOPY WITH LITHOLAPAXY (N/A ) THULIUM LASER TURP (TRANSURETHRAL RESECTION OF PROSTATE) (N/A )  Patient Location: PACU  Anesthesia Type:General  Level of Consciousness: drowsy and patient cooperative  Airway & Oxygen Therapy: Patient Spontanous Breathing and Patient connected to face mask oxygen  Post-op Assessment: Report given to RN and Post -op Vital signs reviewed and stable  Post vital signs: Reviewed and stable  Last Vitals:  Vitals Value Taken Time  BP 142/90 02/14/2018  9:36 AM  Temp    Pulse 86 02/14/2018  9:37 AM  Resp 18 02/14/2018  9:37 AM  SpO2 100 % 02/14/2018  9:37 AM  Vitals shown include unvalidated device data.  Last Pain:  Vitals:   02/14/18 0603  TempSrc:   PainSc: 0-No pain      Patients Stated Pain Goal: 4 (48/18/59 0931)  Complications: No apparent anesthesia complications

## 2018-02-14 NOTE — OR Nursing (Signed)
Doc took stones

## 2018-02-14 NOTE — Op Note (Signed)
Preoperative diagnosis: BPH with lower urinary tract symptoms, weak stream, bladder stones Postoperative diagnosis: Same  Procedure: Cystolitholopaxy 2 cm, thulium laser vaporization of the prostate  Surgeon: Junious Silk  Anesthesia: General  Indication for procedure: 77 year old with BPH and bladder outlet obstruction with bladder stones.  He is on maximal medical therapy.  He elected to proceed with above procedures.  Findings: On cystoscopy there was a right greater than left lateral lobe and a tall obstructing median lobe.  There was a 2 cm bladder stone and for 5 other smaller stones.  Description of procedure: After consent was obtained patient brought to the operating room.  After adequate anesthesia was placed in lithotomy position and prepped and draped in the usual sterile fashion.  A timeout was performed to confirm the patient and procedure.  The cystoscope was passed per urethra and the bladder inspected.  Given the significant amount of stones we decided to use the 1000 m holmium laser fiber and the stones were fragmented mainly at a setting of 1.5 and 10.  The fragments were evacuated.  The thulium laser fiber was then deployed and made incisions at 5:00 and 7:00 through the bladder neck and prostate and brought this down to the verumontanum.  These incisions were made after identifying and marking both ureteral orifice ease.  I then vaporized the median lobe and then did the usual hockey-stick incisions at the apex and vaporized the right and then left lateral lobe from anterior to posterior bladder neck down to the veru which created an excellent channel.  Hemostasis was excellent under low pressure.  The bladder was inspected and a few more fragments were evacuated.  Ureteral orifice ease appeared normal.  The bladder was filled and again hemostasis was excellent under low pressure.  The scope was removed and I placed an 42 [place] coud catheter which was left to gravity drainage in the  drainage and irrigation was clear.  The stones were collected to give to the patient.  Stone fragments as they were.  He was awakened and taken to recovery room in stable condition.  Complications: None  Blood loss: Minimal  Specimen: Stone fragments to patient  Drains: 59 French coud catheter

## 2018-02-14 NOTE — Discharge Instructions (Signed)
Prostate Laser Surgery, Care After This sheet gives you information about how to care for yourself after your procedure. Your health care provider may also give you more specific instructions. If you have problems or questions, contact your health care provider. What can I expect after the procedure? For the first few weeks after the procedure:  You will feel a need to urinate often.  You may have blood in your urine.  You may feel a sudden need to urinate.  Once your urinary catheter is removed, you may have a burning feeling when you urinate, especially at the end of urination. This feeling usually passes within 3-5 days. Follow these instructions at home: Activity  Return to your normal activities as told by your health care provider. Ask your health care provider what activities are safe for you.  Do not do vigorous exercise for 1 week or as told by your health care provider.  Do not lift anything that is heavier than 10 lb (4.5 kg) until your health care provider say it is safe.  Avoid sexual activity for 4-6 weeks or as told by your health care provider.  Do not ride in a car for extended periods of time for 1 month or as told by your health care provider.  Do not drive for 24 hours if you were given a medicine to help you relax (sedative). Diet  Eat foods that are high in fiber, such as fresh fruits and vegetables, whole grains, and beans.  Drink enough fluid to keep your urine clear or pale yellow. Medicines  Take over-the-counter and prescription medicines, including stool softeners, only as told by your health care provider.  If you were prescribed an antibiotic medicine, take it as told by your health care provider. Do not stop taking the antibiotic even if you start to feel better. General instructions  If you were given elastic support stockings, wear them as told by your health care provider.  Do not strain to have a bowel movement. Straining may lead to bleeding  from the prostate and cause clots to form and cause trouble urinating.  Keep all follow-up visits as told by your health care provider. This is important. Contact a health care provider if:  You have a fever or chills.  You have spasms or pain with the urinary catheter still in place.  Once the catheter has been removed, you experience difficulty starting your stream when attempting to urinate. Get help right away if:  There is a blockage in your catheter.  Your catheter has been removed and you are suddenly unable to urinate.  Your urine smells unusually bad.  You start to have blood clots in your urine.  The blood in your urine becomes persistent or gets thick.  You develop chest pains.  You develop shortness of breath.  You develop swelling or pain in your leg. Summary  You may notice urinary symptoms for a few weeks after your procedure.  Follow instructions from your health care provider regarding activity restrictions such as lifting, exercise, and sexual activity.  Contact your health care provider if you have any unusual symptoms during your recovery. This information is not intended to replace advice given to you by your health care provider. Make sure you discuss any questions you have with your health care provider. Document Released: 06/18/2005 Document Revised: 02/03/2016 Document Reviewed: 02/03/2016 Elsevier Interactive Patient Education  2018 [place] A bladder stone is a buildup of crystals made from the  proteins and minerals found in urine. These substances build up when your urine becomes too concentrated. Bladder stones usually develop when you have another medical condition that prevents your bladder from emptying completely. Crystals can form in the small amount of urine left in your bladder. Bladder stones that grow large can become painful and block the flow of urine. What are the causes? Bladder stones can be caused by:  An  enlarged prostate, which prevents the bladder from emptying well.  A urinary tract infection (UTI).  A weak spot in the bladder that creates a small pouch (bladder diverticulum).  Nerve damage that may interfere with the messages from your brain to your bladder muscles (neurogenic bladder). This can result from conditions such as Parkinson disease or spinal cord injuries.  What increases the risk? This condition is more likely to develop in people who:  Get frequent UTIs.  Have another medical condition that affects their bladder.  Have a history of bladder surgery.  Have a spinal cord injury.  Have an abnormally shaped bladder (deformity).  What are the signs or symptoms? Small bladder stones do not always cause symptoms. Larger stones can cause symptoms that include:  Abdominal pain.  A frequent need to urinate.  Difficulty urinating.  Painful urination.  Blood in the urine.  Cloudy or dark colored urine.  Pain in the penis or testicles for men.  How is this diagnosed? This condition is diagnosed based on your symptoms, medical history, and a physical exam. The exam will include checking for abdominal tenderness. For men, a rectal exam may be done to check the prostate gland. You may also have other tests, such as:  A urine test (urinalysis) to find out more about your condition.  A urine sample test to check for other infections (culture).  Blood tests, including tests to look for a substance called creatinine. A creatinine level that is higher than normal could indicate a blockage.  A procedure to examine the inside of your bladder using a thin scope with a tiny lighted camera (cystoscopy) inserted through the urethra.  You may also have imaging studies such as:  A CT scan of your abdomen and pelvis to look for a stone and check whether it is blocking the flow of urine.  An X-ray of your kidneys, ureters, bladder, and urethra after you have a type of dye  (contrast material) injected into your veins (intravenous pyelogram or IVP).  An abdominal and pelvic ultrasound to locate bladder stones and identify areas where urine flow is blocked.  How is this treated? Small bladder stones do not require treatment. They can pass out of your body on their own. You may be instructed to drink extra water to help the stone pass through the bladder. Larger stones may need to be removed with one of the following procedures:  You had a Laser Cystolitholapaxy. A cystoscope is inserted through the urethra and into the bladder to view the stone. A laser is used to break the stone into smaller pieces. Fluids are used to flush the small pieces from the area.    Follow these instructions at home:  Drink enough fluid to keep your urine clear or pale yellow.  Report unusual urinary symptoms to your health care provider. Early diagnosis of an enlarged prostate and other bladder conditions may reduce your chance of getting bladder stones.  Avoid smoking and illegal drug use. Contact a health care provider if:  You have a fever.  You feel nauseous  or vomit.  You are unable to urinate.  You have a large amount of blood in your urine. Get help right away if:  You have severe back pain or lower abdominal pain.  You are vomiting and cannot keep down any medicines or water. This information is not intended to replace advice given to you by your health care provider. Make sure you discuss any questions you have with your health care provider. Document Released: 07/03/2015 Document Revised: 11/25/2015 Document Reviewed: 07/03/2015 Elsevier Interactive Patient Education  [employer].

## 2018-02-14 NOTE — Anesthesia Postprocedure Evaluation (Signed)
Anesthesia Post Note  Patient: Zachary Vaughn  Procedure(s) Performed: CYSTOSCOPY WITH LITHOLAPAXY (N/A ) THULIUM LASER TURP (TRANSURETHRAL RESECTION OF PROSTATE) (N/A )     Patient location during evaluation: PACU Anesthesia Type: General Level of consciousness: sedated and patient cooperative Pain management: pain level controlled Vital Signs Assessment: post-procedure vital signs reviewed and stable Respiratory status: spontaneous breathing Cardiovascular status: stable Anesthetic complications: no    Last Vitals:  Vitals:   02/14/18 1000 02/14/18 1015  BP: (!) 149/92 139/90  Pulse: 81 82  Resp: 15 18  Temp:    SpO2: 94% 93%    Last Pain:  Vitals:   02/14/18 1015  TempSrc:   PainSc: 3                  Nolon Nations

## 2018-02-14 NOTE — Interval H&P Note (Signed)
History and Physical Interval Note:  02/14/2018 7:21 AM  Zachary Vaughn  has presented today for surgery, with the diagnosis of BENIGN PROSTATIC HYPERPLASIA, THULIUM LASER OF THE PROSTATE  The various methods of treatment were discussed with patient and family again this morning including the nature, potential benefits, risks and alternatives to Procedure(s) with comments: CYSTOSCOPY WITH LITHOLAPAXY (Left) - ONLY NEEDS 90 MIN TOTAL FOR ALL PROCEDURES THULIUM LASER TURP (TRANSURETHRAL RESECTION OF PROSTATE) (N/A) as a surgical intervention including side effects of the proposed treatment, the likelihood of the patient achieving the goals of the procedure, and any potential problems that might occur during the procedure or recuperation. All questions answered. Patient elects to proceed. The patient's history has been reviewed, patient examined, no change in status, stable for surgery.  I have reviewed the patient's chart and labs. He is well with no dysuria or fever. Voiding better the last few days (no foley).  Questions were answered to the patient's satisfaction.     Festus Aloe

## 2018-02-14 NOTE — Anesthesia Procedure Notes (Signed)
Procedure Name: LMA Insertion Date/Time: 02/14/2018 7:39 AM Performed by: Montel Clock, CRNA Pre-anesthesia Checklist: Patient identified, Emergency Drugs available, Suction available, Patient being monitored and Timeout performed Patient Re-evaluated:Patient Re-evaluated prior to induction Oxygen Delivery Method: Circle system utilized Preoxygenation: Pre-oxygenation with 100% oxygen Induction Type: IV induction LMA: LMA with gastric port inserted LMA Size: 4.0 Number of attempts: 1 Dental Injury: Teeth and Oropharynx as per pre-operative assessment

## 2018-02-15 ENCOUNTER — Encounter (HOSPITAL_COMMUNITY): Payer: Self-pay | Admitting: Urology

## 2018-02-25 DIAGNOSIS — E1159 Type 2 diabetes mellitus with other circulatory complications: Secondary | ICD-10-CM | POA: Diagnosis not present

## 2018-03-29 DIAGNOSIS — Z23 Encounter for immunization: Secondary | ICD-10-CM | POA: Diagnosis not present

## 2018-04-20 DIAGNOSIS — E1159 Type 2 diabetes mellitus with other circulatory complications: Secondary | ICD-10-CM | POA: Diagnosis not present

## 2018-05-14 DIAGNOSIS — M25551 Pain in right hip: Secondary | ICD-10-CM | POA: Diagnosis not present

## 2018-05-21 DIAGNOSIS — R350 Frequency of micturition: Secondary | ICD-10-CM | POA: Diagnosis not present

## 2018-05-21 DIAGNOSIS — M25551 Pain in right hip: Secondary | ICD-10-CM | POA: Diagnosis not present

## 2018-05-21 DIAGNOSIS — N401 Enlarged prostate with lower urinary tract symptoms: Secondary | ICD-10-CM | POA: Diagnosis not present

## 2018-06-18 DIAGNOSIS — E1159 Type 2 diabetes mellitus with other circulatory complications: Secondary | ICD-10-CM | POA: Diagnosis not present

## 2018-07-17 DIAGNOSIS — M4800 Spinal stenosis, site unspecified: Secondary | ICD-10-CM | POA: Diagnosis not present

## 2018-07-17 DIAGNOSIS — E1159 Type 2 diabetes mellitus with other circulatory complications: Secondary | ICD-10-CM | POA: Diagnosis not present

## 2018-07-17 DIAGNOSIS — M25551 Pain in right hip: Secondary | ICD-10-CM | POA: Diagnosis not present

## 2018-07-17 DIAGNOSIS — I10 Essential (primary) hypertension: Secondary | ICD-10-CM | POA: Diagnosis not present

## 2018-07-18 ENCOUNTER — Other Ambulatory Visit: Payer: Self-pay | Admitting: Surgical

## 2018-07-31 DIAGNOSIS — E1159 Type 2 diabetes mellitus with other circulatory complications: Secondary | ICD-10-CM | POA: Diagnosis not present

## 2018-07-31 DIAGNOSIS — I10 Essential (primary) hypertension: Secondary | ICD-10-CM | POA: Diagnosis not present

## 2018-07-31 DIAGNOSIS — Z794 Long term (current) use of insulin: Secondary | ICD-10-CM | POA: Diagnosis not present

## 2018-07-31 DIAGNOSIS — M25551 Pain in right hip: Secondary | ICD-10-CM | POA: Diagnosis not present

## 2018-08-08 DIAGNOSIS — M25551 Pain in right hip: Secondary | ICD-10-CM | POA: Diagnosis not present

## 2018-08-08 DIAGNOSIS — M1611 Unilateral primary osteoarthritis, right hip: Secondary | ICD-10-CM | POA: Diagnosis not present

## 2018-08-13 DIAGNOSIS — I10 Essential (primary) hypertension: Secondary | ICD-10-CM | POA: Diagnosis not present

## 2018-08-13 DIAGNOSIS — M25551 Pain in right hip: Secondary | ICD-10-CM | POA: Diagnosis not present

## 2018-08-13 DIAGNOSIS — E1159 Type 2 diabetes mellitus with other circulatory complications: Secondary | ICD-10-CM | POA: Diagnosis not present

## 2018-08-13 DIAGNOSIS — Z794 Long term (current) use of insulin: Secondary | ICD-10-CM | POA: Diagnosis not present

## 2018-08-15 NOTE — Patient Instructions (Addendum)
Zachary Vaughn  06-21-1941     Your procedure is scheduled on:  08-27-2018    Report to [facility] Main  Entrance,  Report to admitting at  5:30 AM    Call this number if you have problems the morning of surgery [phone]       Remember: Do not eat food or drink liquids :After Midnight.    BRUSH YOUR TEETH MORNING OF SURGERY AND RINSE YOUR MOUTH OUT, NO CHEWING GUM CANDY OR MINTS.        Take these medicines the morning of surgery with A SIP OF WATER:    Finasteride (proscar),  Omeprazole (prilosec), Rosuvastatin (crestor)  DO NOT TAKE ANY DIABETIC MEDICATIONS DAY OF YOUR SURGERY                                  You may not have any metal on your body including hair pins and               piercings  Do not wear jewelry, make-up, lotions, powders or perfumes, deodorant                         Men may shave face and neck.       Do not bring valuables to the hospital. [place].  Contacts, dentures or bridgework may not be worn into surgery.  Leave suitcase in the car. After surgery it may be brought to your room.    _____________________________________________________________________  How to Manage Your Diabetes Before and After Surgery  Why is it important to control my blood sugar before and after surgery? . Improving blood sugar levels before and after surgery helps healing and can limit problems. . A way of improving blood sugar control is eating a healthy diet by: o  Eating less sugar and carbohydrates o  Increasing activity/exercise o  Talking with your doctor about reaching your blood sugar goals . High blood sugars (greater than 180 mg/dL) can raise your risk of infections and slow your recovery, so you will need to focus on controlling your diabetes during the weeks before surgery. . Make sure that the doctor who takes care of your diabetes knows about your planned  surgery including the date and location.  How do I manage my blood sugar before surgery? . Check your blood sugar at least 4 times a day, starting 2 days before surgery, to make sure that the level is not too high or low. o Check your blood sugar the morning of your surgery when you wake up and every 2 hours until you get to the Short Stay unit. . If your blood sugar is less than 70 mg/dL, you will need to treat for low blood sugar: o Do not take insulin. o Call Short Stay before ---Treat a low blood sugar (less than 70 mg/dL) with  cup of clear juice (cranberry or apple), 4 glucose tablets, OR glucose gel. o Recheck blood sugar in 15 minutes after treatment (to make sure it is greater than 70 mg/dL). If your blood sugar is not greater than 70 mg/dL on recheck, call [phone] for further instructions. . Report your blood sugar to the short stay nurse  when you get to Short Stay.  . If you are admitted to the hospital after surgery: o Your blood sugar will be checked by the staff and you will probably be given insulin after surgery (instead of oral diabetes medicines) to make sure you have good blood sugar levels. o The goal for blood sugar control after surgery is 80-180 mg/dL.   WHAT DO I DO ABOUT MY DIABETES MEDICATION?  Marland Kitchen Do not take oral diabetes medicines (pills) the morning of surgery. .   . THE NIGHT BEFORE SURGERY, take   NO   units of   Humalog    insulin.       . THE MORNING OF SURGERY, take   HALF DOSE  units of    TOUJEO     insulin.  The day of surgery,  take  Oral diabetic medication as usual ,  Your AM HUMALOG AND TOUJEO AS USUSAL    . If your CBG is greater than 220 mg/dL, you may take  of your sliding scale   Patient Signature:  Date:   Nurse Signature:  Date:   Reviewed and Endorsed by [facility] Patient Education Committee, August 2015           [facility] - Preparing for Surgery Before surgery, you can play an important role.  Because skin is not  sterile, your skin needs to be as free of germs as possible.  You can reduce the number of germs on your skin by washing with CHG (chlorahexidine gluconate) soap before surgery.  CHG is an antiseptic cleaner which kills germs and bonds with the skin to continue killing germs even after washing. Please DO NOT use if you have an allergy to CHG or antibacterial soaps.  If your skin becomes reddened/irritated stop using the CHG and inform your nurse when you arrive at Short Stay. Do not shave (including legs and underarms) for at least 48 hours prior to the first CHG shower.  You may shave your face/neck. Please follow these instructions carefully:  1.  Shower with CHG Soap the night before surgery and the  morning of Surgery.  2.  If you choose to wash your hair, wash your hair first as usual with your  normal  shampoo.  3.  After you shampoo, rinse your hair and body thoroughly to remove the  shampoo.                            4.  Use CHG as you would any other liquid soap.  You can apply chg directly  to the skin and wash                       Gently with a scrungie or clean washcloth.  5.  Apply the CHG Soap to your body ONLY FROM THE NECK DOWN.   Do not use on face/ open                           Wound or open sores. Avoid contact with eyes, ears mouth and genitals (private parts).                       Wash face,  Genitals (private parts) with your normal soap.             6.  Wash thoroughly, paying special attention to the  area where your surgery  will be performed.  7.  Thoroughly rinse your body with warm water from the neck down.  8.  DO NOT shower/wash with your normal soap after using and rinsing off  the CHG Soap.             9.  Pat yourself dry with a clean towel.            10.  Wear clean pajamas.            11.  Place clean sheets on your bed the night of your first shower and do not  sleep with pets. Day of Surgery : Do not apply any lotions/deodorants the morning of surgery.   Please wear clean clothes to the hospital/surgery center.  ________________________________________________________________________   Incentive Spirometer  An incentive spirometer is a tool that can help keep your lungs clear and active. This tool measures how well you are filling your lungs with each breath. Taking long deep breaths may help reverse or decrease the chance of developing breathing (pulmonary) problems (especially infection) following:  A long period of time when you are unable to move or be active. BEFORE THE PROCEDURE   If the spirometer includes an indicator to show your best effort, your nurse or respiratory therapist will set it to a desired goal.  If possible, sit up straight or lean slightly forward. Try not to slouch.  Hold the incentive spirometer in an upright position. INSTRUCTIONS FOR USE  1. Sit on the edge of your bed if possible, or sit up as far as you can in bed or on a chair. 2. Hold the incentive spirometer in an upright position. 3. Breathe out normally. 4. Place the mouthpiece in your mouth and seal your lips tightly around it. 5. Breathe in slowly and as deeply as possible, raising the piston or the ball toward the top of the column. 6. Hold your breath for 3-5 seconds or for as long as possible. Allow the piston or ball to fall to the bottom of the column. 7. Remove the mouthpiece from your mouth and breathe out normally. 8. Rest for a few seconds and repeat Steps 1 through 7 at least 10 times every 1-2 hours when you are awake. Take your time and take a few normal breaths between deep breaths. 9. The spirometer may include an indicator to show your best effort. Use the indicator as a goal to work toward during each repetition. 10. After each set of 10 deep breaths, practice coughing to be sure your lungs are clear. If you have an incision (the cut made at the time of surgery), support your incision when coughing by placing a pillow or rolled up towels  firmly against it. Once you are able to get out of bed, walk around indoors and cough well. You may stop using the incentive spirometer when instructed by your caregiver.  RISKS AND COMPLICATIONS  Take your time so you do not get dizzy or light-headed.  If you are in pain, you may need to take or ask for pain medication before doing incentive spirometry. It is harder to take a deep breath if you are having pain. AFTER USE  Rest and breathe slowly and easily.  It can be helpful to keep track of a log of your progress. Your caregiver can provide you with a simple table to help with this. If you are using the spirometer at home, follow these instructions: [place] IF:  You are having difficultly using the spirometer.  You have trouble using the spirometer as often as instructed.  Your pain medication is not giving enough relief while using the spirometer.  You develop fever of 100.5 F (38.1 C) or higher. SEEK IMMEDIATE MEDICAL CARE IF:   You cough up bloody sputum that had not been present before.  You develop fever of 102 F (38.9 C) or greater.  You develop worsening pain at or near the incision site. MAKE SURE YOU:   Understand these instructions.  Will watch your condition.  Will get help right away if you are not doing well or get worse. Document Released: 10/29/2006 Document Revised: 09/10/2011 Document Reviewed: 12/30/2006 ExitCare Patient Information 2014 ExitCare, [place].   ________________________________________________________________________  WHAT IS A BLOOD TRANSFUSION? Blood Transfusion Information  A transfusion is the replacement of blood or some of its parts. Blood is made up of multiple cells which provide different functions.  Red blood cells carry oxygen and are used for blood loss replacement.  White blood cells fight against infection.  Platelets control bleeding.  Plasma helps clot blood.  Other blood products are available for  specialized needs, such as hemophilia or other clotting disorders. BEFORE THE TRANSFUSION  Who gives blood for transfusions?   Healthy volunteers who are fully evaluated to make sure their blood is safe. This is blood bank blood. Transfusion therapy is the safest it has ever been in the practice of medicine. Before blood is taken from a donor, a complete history is taken to make sure that person has no history of diseases nor engages in risky social behavior (examples are intravenous drug use or sexual activity with multiple partners). The donor's travel history is screened to minimize risk of transmitting infections, such as malaria. The donated blood is tested for signs of infectious diseases, such as HIV and hepatitis. The blood is then tested to be sure it is compatible with you in order to minimize the chance of a transfusion reaction. If you or a relative donates blood, this is often done in anticipation of surgery and is not appropriate for emergency situations. It takes many days to process the donated blood. RISKS AND COMPLICATIONS Although transfusion therapy is very safe and saves many lives, the main dangers of transfusion include:   Getting an infectious disease.  Developing a transfusion reaction. This is an allergic reaction to something in the blood you were given. Every precaution is taken to prevent this. The decision to have a blood transfusion has been considered carefully by your caregiver before blood is given. Blood is not given unless the benefits outweigh the risks. AFTER THE TRANSFUSION  Right after receiving a blood transfusion, you will usually feel much better and more energetic. This is especially true if your red blood cells have gotten low (anemic). The transfusion raises the level of the red blood cells which carry oxygen, and this usually causes an energy increase.  The nurse administering the transfusion will monitor you carefully for complications. HOME CARE  INSTRUCTIONS  No special instructions are needed after a transfusion. You may find your energy is better. Speak with your caregiver about any limitations on activity for underlying diseases you may have. SEEK MEDICAL CARE IF:   Your condition is not improving after your transfusion.  You develop redness or irritation at the intravenous (IV) site. SEEK IMMEDIATE MEDICAL CARE IF:  Any of the following symptoms occur over the next 12 hours:  Shaking chills.  You have a temperature  by mouth above 102 F (38.9 C), not controlled by medicine.  Chest, back, or muscle pain.  People around you feel you are not acting correctly or are confused.  Shortness of breath or difficulty breathing.  Dizziness and fainting.  You get a rash or develop hives.  You have a decrease in urine output.  Your urine turns a dark color or changes to pink, red, or brown. Any of the following symptoms occur over the next 10 days:  You have a temperature by mouth above 102 F (38.9 C), not controlled by medicine.  Shortness of breath.  Weakness after normal activity.  The white part of the eye turns yellow (jaundice).  You have a decrease in the amount of urine or are urinating less often.  Your urine turns a dark color or changes to pink, red, or brown. Document Released: 06/15/2000 Document Revised: 09/10/2011 Document Reviewed: 02/02/2008 [facility] Patient Information 2014 [place], [place].  _______________________________________________________________________

## 2018-08-18 NOTE — H&P (Signed)
TOTAL HIP ADMISSION H&P  Patient is admitted for right total hip arthroplasty.  Subjective:  Chief Complaint: right hip pain  HPI: Zachary Vaughn, 78 y.o. male, has a history of pain and functional disability in the right hip(s) due to arthritis and patient has failed non-surgical conservative treatments for greater than 12 weeks to include corticosteriod injections and activity modification.  Onset of symptoms was gradual starting 7 years ago with gradually worsening course since that time.The patient noted no past surgery on the right hip(s).  Patient currently rates pain in the right hip at 8 out of 10 with activity. Patient has worsening of pain with activity and weight bearing and pain that interfers with activities of daily living. Patient has evidence of severe bone-on-bone arthritis with large osteophytes and cysts. by imaging studies. There is no current active infection.  Patient Active Problem List   Diagnosis Date Noted  . S/P lumbar laminectomy 11/21/2017  . Personal history of colonic adenomas 07/01/2012   Past Medical History:  Diagnosis Date  . Actinic keratosis   . Allergic rhinitis    seasonal   . BPH (benign prostatic hyperplasia)   . Complication of anesthesia    Pt couldn't sleep x 7 days, hypervigilant  . DDD (degenerative disc disease), lumbar   . Diverticulosis   . DM (diabetes mellitus) ([place])   . Erectile dysfunction   . GERD (gastroesophageal reflux disease)   . History of kidney stones    LAST FLARE UP 08/2017  . History of shingles 2001  . Hyperlipidemia   . Hypertension   . Kidney stone   . Osteoporosis   . Polyp of colon, adenomatous [id number]  . Spinal stenosis   . Spondylosis     Past Surgical History:  Procedure Laterality Date  . APPENDECTOMY    . BACK SURGERY    . COLONOSCOPY    . CYSTOSCOPY WITH LITHOLAPAXY N/A 02/14/2018   Procedure: CYSTOSCOPY WITH LITHOLAPAXY;  Surgeon: Festus Aloe, MD;  Location: [facility];  Service: Urology;   Laterality: N/A;  ONLY NEEDS 90 MIN TOTAL FOR ALL PROCEDURES  . FRACTURE SURGERY     ROD DOWN IN LEFT LEG  . LUMBAR LAMINECTOMY/DECOMPRESSION MICRODISCECTOMY Bilateral 11/21/2017   Procedure: Laminectomy and Foraminotomy - Lumbar Three-Lumbar Four - Lumbar Four-Lumbar Five - bilateral;  Surgeon: Eustace Moore, MD;  Location: [place];  Service: Neurosurgery;  Laterality: Bilateral;  Laminectomy and Foraminotomy - Lumbar Three-Lumbar Four - Lumbar Four-Lumbar Five - bilateral  . THULIUM LASER TURP (TRANSURETHRAL RESECTION OF PROSTATE) N/A 02/14/2018   Procedure: THULIUM LASER TURP (TRANSURETHRAL RESECTION OF PROSTATE);  Surgeon: Festus Aloe, MD;  Location: [facility];  Service: Urology;  Laterality: N/A;  . tib/fib fx  10/2008   left leg, rod put in    No current facility-administered medications for this encounter.    Current Outpatient Medications  Medication Sig Dispense Refill Last Dose  . finasteride (PROSCAR) 5 MG tablet Take 5 mg by mouth every morning.    02/13/2018 at Unknown time  . insulin lispro (HUMALOG) 100 UNIT/ML injection Inject 10 Units into the skin 2 (two) times daily.     . metFORMIN (GLUCOPHAGE) 1000 MG tablet Take 1,000 mg by mouth daily with breakfast.   1 02/13/2018 at Unknown time  . naproxen sodium (ALEVE) 220 MG tablet Take 440 mg by mouth every morning.    02/13/2018 at Unknown time  . omeprazole (PRILOSEC) 20 MG capsule Take 20 mg by mouth every morning.  02/13/2018 at Unknown time  . pioglitazone (ACTOS) 30 MG tablet Take 30 mg by mouth every morning.    02/13/2018 at Unknown time  . ramipril (ALTACE) 2.5 MG capsule Take 2.5 mg by mouth every morning.    02/13/2018 at Unknown time  . rosuvastatin (CRESTOR) 20 MG tablet Take 20 mg by mouth every morning.    02/13/2018 at Unknown time  . TOUJEO SOLOSTAR 300 UNIT/ML SOPN Inject 60 Units as directed every morning.   2 02/13/2018 at Unknown time  . HYDROcodone-acetaminophen (NORCO) 7.5-325 MG tablet Take 1 tablet by mouth every  6 (six) hours. (Patient not taking: Reported on 01/30/2018) 30 tablet 0 Completed Course at Unknown time  . tamsulosin (FLOMAX) 0.4 MG CAPS capsule Take 1 capsule (0.4 mg total) by mouth daily after supper. (Patient not taking: Reported on 08/12/2018)   Not Taking at Unknown time   Allergies  Allergen Reactions  . Peanut-Containing Drug Products Hives    Fever blisters around mouth     Social History   Tobacco Use  . Smoking status: Never Smoker  . Smokeless tobacco: Never Used  Substance Use Topics  . Alcohol use: No    Family History  Problem Relation Age of Onset  . Pulmonary fibrosis Mother   . Pancreatic cancer Sister        intraabdominal tumor  . Liver disease Father   . Alcoholism Father      ROS Constitutional Constitutional: no fever, no chills, no night sweats, no significant weight loss  Cardiovascular Cardiovascular: no chest pain, no palpitations  Respiratory Respiratory: no cough, no shortness of breath, No COPD  Gastrointestinal Gastrointestinal: no vomiting, no nausea  Musculoskeletal Musculoskeletal: no swelling in Joints, Joint Pain  Neurologic Neurologic: no numbness, no tingling, no difficulty with balance  Objective:  Physical Exam  Well nourished and well developed.  General: Alert and oriented x3, cooperative and pleasant, no acute distress.  Head: normocephalic, atraumatic, neck supple.  Eyes: EOMI.  Respiratory: breath sounds clear in all fields, no wheezing, rales, or rhonchi. Cardiovascular: Regular rate and rhythm, no murmurs, gallops or rubs.  Abdomen: non-tender to palpation and soft, normoactive bowel sounds. Musculoskeletal:  Right Hip Exam: right hip shows limited range of motion with full extension. Further flexion to 100. External rotation of 15. Internal rotation of -5 with pain  Calves soft and nontender. Motor function intact in LE. Strength 5/5 LE bilaterally. Neuro: Distal pulses 2+. Sensation to light touch intact in  LE.  Vital signs in last 24 hours: BP: 120/82 mmHg  Labs:   Estimated body mass index is 30.14 kg/m as calculated from the following:   Height as of 02/14/18: 5\' 8"  (1.727 m).   Weight as of 02/14/18: 89.9 kg.   Imaging Review Plain radiographs demonstrate severe degenerative joint disease of the right hip(s). The bone quality appears to be adequate for age and reported activity level.      Assessment/Plan:  End stage arthritis, right hip(s)  The patient history, physical examination, clinical judgement of the provider and imaging studies are consistent with end stage degenerative joint disease of the right hip(s) and total hip arthroplasty is deemed medically necessary. The treatment options including medical management, injection therapy, arthroscopy and arthroplasty were discussed at length. The risks and benefits of total hip arthroplasty were presented and reviewed. The risks due to aseptic loosening, infection, stiffness, dislocation/subluxation,  thromboembolic complications and other imponderables were discussed.  The patient acknowledged the explanation, agreed to proceed with the  plan and consent was signed. Patient is being admitted for inpatient treatment for surgery, pain control, PT, OT, prophylactic antibiotics, VTE prophylaxis, progressive ambulation and ADL's and discharge planning.The patient is planning to be discharged home with HEP.   Anticipated LOS equal to or greater than 2 midnights due to - Age 44 and older with one or more of the following:  - Obesity  - Expected need for hospital services (PT, OT, Nursing) required for safe  discharge  - Anticipated need for postoperative skilled nursing care or inpatient rehab  - Active co-morbidities: Diabetes OR   - Unanticipated findings during/Post Surgery: None  - Patient is a high risk of re-admission due to: None    Therapy Plans: HEP Disposition: Home with daughter Planned DVT Prophylaxis: Aspirin 325 mg  BID DME needed: None PCP: Crist Infante, MD TXA: IV Allergies: NKDA Anesthesia Concerns: None BMI: 29.6 Last HgbA1c: 9.0% Other: Patient has been working on strict glucose control prior to last A1c. We will add on an order for a final A1c with pre-operative labs. Patient is aware that if this is above 7.5% we will need to reschedule his surgery.  - Patient was instructed on what medications to stop prior to surgery. - Follow-up visit in 2 weeks with Dr. Wynelle Link - Begin physical therapy following surgery - Pre-operative lab work as pre-surgical testing - Prescriptions will be provided in hospital at time of discharge  Griffith Citron, PA-C Orthopedic Surgery [facility] [place]

## 2018-08-20 ENCOUNTER — Encounter (HOSPITAL_COMMUNITY): Payer: Self-pay

## 2018-08-20 ENCOUNTER — Encounter (INDEPENDENT_AMBULATORY_CARE_PROVIDER_SITE_OTHER): Payer: Self-pay

## 2018-08-20 ENCOUNTER — Encounter (HOSPITAL_COMMUNITY)
Admission: RE | Admit: 2018-08-20 | Discharge: 2018-08-20 | Disposition: A | Discharge status: Home / Self Care | Payer: Medicare Other | Source: Ambulatory Visit | Attending: Orthopedic Surgery | Admitting: Orthopedic Surgery

## 2018-08-20 ENCOUNTER — Other Ambulatory Visit: Payer: Self-pay

## 2018-08-20 DIAGNOSIS — M1611 Unilateral primary osteoarthritis, right hip: Secondary | ICD-10-CM | POA: Insufficient documentation

## 2018-08-20 DIAGNOSIS — Z01812 Encounter for preprocedural laboratory examination: Secondary | ICD-10-CM | POA: Insufficient documentation

## 2018-08-20 HISTORY — DX: Type 2 diabetes mellitus without complications: E11.9

## 2018-08-20 HISTORY — DX: Personal history of other diseases of urinary system: Z87.448

## 2018-08-20 HISTORY — DX: Long term (current) use of insulin: Z79.4

## 2018-08-20 HISTORY — DX: Benign prostatic hyperplasia with lower urinary tract symptoms: N40.1

## 2018-08-20 HISTORY — DX: Presence of spectacles and contact lenses: Z97.3

## 2018-08-20 HISTORY — DX: Unspecified osteoarthritis, unspecified site: M19.90

## 2018-08-20 LAB — CBC
HCT: 45.2 % (ref 39.0–52.0)
Hemoglobin: 14.1 g/dL (ref 13.0–17.0)
MCH: 28.5 pg (ref 26.0–34.0)
MCHC: 31.2 g/dL (ref 30.0–36.0)
MCV: 91.3 fL (ref 80.0–100.0)
Platelets: 181 10*3/uL (ref 150–400)
RBC: 4.95 MIL/uL (ref 4.22–5.81)
RDW: 14.2 % (ref 11.5–15.5)
WBC: 8.7 10*3/uL (ref 4.0–10.5)
nRBC: 0 % (ref 0.0–0.2)

## 2018-08-20 LAB — COMPREHENSIVE METABOLIC PANEL
ALT: 16 U/L (ref 0–44)
AST: 18 U/L (ref 15–41)
Albumin: 4.1 g/dL (ref 3.5–5.0)
Alkaline Phosphatase: 85 U/L (ref 38–126)
Anion gap: 6 (ref 5–15)
BUN: 14 mg/dL (ref 8–23)
CO2: 26 mmol/L (ref 22–32)
Calcium: 9 mg/dL (ref 8.9–10.3)
Chloride: 110 mmol/L (ref 98–111)
Creatinine, Ser: 1.04 mg/dL (ref 0.61–1.24)
GFR calc Af Amer: >60 mL/min (ref >60)
GFR calc non Af Amer: >60 mL/min (ref >60)
Glucose, Bld: 107 mg/dL — ABNORMAL HIGH (ref 70–99)
Potassium: 4.3 mmol/L (ref 3.5–5.1)
Sodium: 142 mmol/L (ref 135–145)
Total Bilirubin: 1 mg/dL (ref 0.3–1.2)
Total Protein: 6.5 g/dL (ref 6.5–8.1)

## 2018-08-20 LAB — APTT: aPTT: 30 seconds (ref 24–36)

## 2018-08-20 LAB — PROTIME-INR
INR: 1.19
Prothrombin Time: 15 seconds (ref 11.4–15.2)

## 2018-08-20 LAB — SURGICAL PCR SCREEN
MRSA, PCR: NEGATIVE
Staphylococcus aureus: NEGATIVE

## 2018-08-20 LAB — GLUCOSE, CAPILLARY: Glucose-Capillary: 96 mg/dL (ref 70–99)

## 2018-08-20 LAB — ABO/RH: ABO/RH(D): O POS

## 2018-08-20 NOTE — Progress Notes (Signed)
EKG dated 11-14-2017 in epic.  CXR dated 11-14-2017 in epic.  ADDENDUM:  Chart to anesthesia for review, Konrad Felix PA.

## 2018-08-21 ENCOUNTER — Encounter (HOSPITAL_COMMUNITY): Payer: Self-pay

## 2018-08-21 LAB — HEMOGLOBIN A1C
Hgb A1c MFr Bld: 8.3 % — ABNORMAL HIGH (ref 4.8–5.6)
Mean Plasma Glucose: 192 mg/dL

## 2018-08-26 DIAGNOSIS — R300 Dysuria: Secondary | ICD-10-CM | POA: Diagnosis not present

## 2018-08-26 DIAGNOSIS — R350 Frequency of micturition: Secondary | ICD-10-CM | POA: Diagnosis not present

## 2018-08-26 DIAGNOSIS — N401 Enlarged prostate with lower urinary tract symptoms: Secondary | ICD-10-CM | POA: Diagnosis not present

## 2018-08-27 ENCOUNTER — Encounter (HOSPITAL_COMMUNITY): Admission: RE | Payer: Self-pay | Source: Home / Self Care

## 2018-08-27 ENCOUNTER — Inpatient Hospital Stay (HOSPITAL_COMMUNITY): Admission: RE | Admit: 2018-08-27 | Payer: Medicare Other | Source: Home / Self Care | Admitting: Orthopedic Surgery

## 2018-08-27 LAB — TYPE AND SCREEN
ABO/RH(D): O POS
Antibody Screen: NEGATIVE

## 2018-08-27 SURGERY — ARTHROPLASTY, HIP, TOTAL, ANTERIOR APPROACH
Anesthesia: Choice | Laterality: Right

## 2018-09-08 DIAGNOSIS — R3915 Urgency of urination: Secondary | ICD-10-CM | POA: Diagnosis not present

## 2018-09-08 DIAGNOSIS — R350 Frequency of micturition: Secondary | ICD-10-CM | POA: Diagnosis not present

## 2018-09-10 DIAGNOSIS — Z0120 Encounter for dental examination and cleaning without abnormal findings: Secondary | ICD-10-CM | POA: Diagnosis not present

## 2018-10-27 DIAGNOSIS — R350 Frequency of micturition: Secondary | ICD-10-CM | POA: Diagnosis not present

## 2018-10-27 DIAGNOSIS — R3913 Splitting of urinary stream: Secondary | ICD-10-CM | POA: Diagnosis not present

## 2018-10-27 DIAGNOSIS — N401 Enlarged prostate with lower urinary tract symptoms: Secondary | ICD-10-CM | POA: Diagnosis not present

## 2018-11-05 ENCOUNTER — Inpatient Hospital Stay: Admit: 2018-11-05 | Payer: Medicare Other | Admitting: Orthopedic Surgery

## 2018-11-05 SURGERY — ARTHROPLASTY, HIP, TOTAL, ANTERIOR APPROACH
Anesthesia: Choice | Laterality: Right

## 2018-11-18 DIAGNOSIS — R350 Frequency of micturition: Secondary | ICD-10-CM | POA: Diagnosis not present

## 2018-11-18 DIAGNOSIS — R300 Dysuria: Secondary | ICD-10-CM | POA: Diagnosis not present

## 2018-11-21 DIAGNOSIS — M810 Age-related osteoporosis without current pathological fracture: Secondary | ICD-10-CM | POA: Diagnosis not present

## 2018-11-21 DIAGNOSIS — E7849 Other hyperlipidemia: Secondary | ICD-10-CM | POA: Diagnosis not present

## 2018-11-21 DIAGNOSIS — Z125 Encounter for screening for malignant neoplasm of prostate: Secondary | ICD-10-CM | POA: Diagnosis not present

## 2018-11-21 DIAGNOSIS — E1159 Type 2 diabetes mellitus with other circulatory complications: Secondary | ICD-10-CM | POA: Diagnosis not present

## 2018-11-25 DIAGNOSIS — Z0000 Encounter for general adult medical examination without abnormal findings: Secondary | ICD-10-CM | POA: Diagnosis not present

## 2018-11-25 DIAGNOSIS — Z794 Long term (current) use of insulin: Secondary | ICD-10-CM | POA: Diagnosis not present

## 2018-11-25 DIAGNOSIS — Z1331 Encounter for screening for depression: Secondary | ICD-10-CM | POA: Diagnosis not present

## 2018-11-25 DIAGNOSIS — M810 Age-related osteoporosis without current pathological fracture: Secondary | ICD-10-CM | POA: Diagnosis not present

## 2018-11-25 DIAGNOSIS — E1159 Type 2 diabetes mellitus with other circulatory complications: Secondary | ICD-10-CM | POA: Diagnosis not present

## 2018-12-01 DIAGNOSIS — R310 Gross hematuria: Secondary | ICD-10-CM | POA: Diagnosis not present

## 2018-12-01 DIAGNOSIS — N401 Enlarged prostate with lower urinary tract symptoms: Secondary | ICD-10-CM | POA: Diagnosis not present

## 2018-12-01 DIAGNOSIS — R350 Frequency of micturition: Secondary | ICD-10-CM | POA: Diagnosis not present

## 2018-12-02 DIAGNOSIS — R310 Gross hematuria: Secondary | ICD-10-CM | POA: Diagnosis not present

## 2018-12-02 DIAGNOSIS — N200 Calculus of kidney: Secondary | ICD-10-CM | POA: Diagnosis not present

## 2018-12-02 DIAGNOSIS — N3289 Other specified disorders of bladder: Secondary | ICD-10-CM | POA: Diagnosis not present

## 2018-12-10 NOTE — H&P (Signed)
TOTAL HIP ADMISSION H&P  Patient is admitted for right total hip arthroplasty.  Subjective:  Chief Complaint: right hip pain  HPI: Zachary Vaughn, 78 y.o. male, has a history of pain and functional disability in the right hip(s) due to arthritis and patient has failed non-surgical conservative treatments for greater than 12 weeks to include corticosteriod injections and activity modification.  Onset of symptoms was gradual starting 7 years ago with gradually worsening course since that time.The patient noted no past surgery on the right hip(s).  Patient currently rates pain in the right hip at 10 out of 10 with activity. Patient has night pain, worsening of pain with activity and weight bearing and pain that interfers with activities of daily living. Patient has evidence of severe bone-on-bone arthritis with large osteophytes and cysts by imaging studies. This condition presents safety issues increasing the risk of falls. There is no current active infection.  Patient Active Problem List   Diagnosis Date Noted  . S/P lumbar laminectomy 11/21/2017  . Personal history of colonic adenomas 07/01/2012   Past Medical History:  Diagnosis Date  . Actinic keratosis   . Allergic rhinitis    seasonal   . 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 Complication of anesthesia    per Pt after back surgery 05/ 2019 couldn't sleep x 7 days, hypervigilant  . DDD (degenerative disc disease), lumbar   . Diverticulosis   . Erectile dysfunction   . GERD (gastroesophageal reflux disease)   . History of bladder stone   . History of bladder stone   . History of kidney stones   . Hyperlipidemia   . Hypertension   . OA (osteoarthritis)    hips  . Osteoporosis   . Spinal stenosis   . Spondylosis   . Type 2 diabetes mellitus treated with insulin ([place])    followed by pcp  . Wears glasses     Past Surgical History:  Procedure Laterality  Date  . APPENDECTOMY  1975  . COLONOSCOPY    . CYSTOSCOPY WITH LITHOLAPAXY N/A 02/14/2018   Procedure: CYSTOSCOPY WITH LITHOLAPAXY;  Surgeon: Festus Aloe, MD;  Location: [facility];  Service: Urology;  Laterality: N/A;  ONLY NEEDS 90 MIN TOTAL FOR ALL PROCEDURES  . FRACTURE SURGERY Left 05/ 10/ 2010   ROD DOWN IN LEFT LEG;   tib-fib fx's;   per pt hardware remains  . LUMBAR LAMINECTOMY/DECOMPRESSION MICRODISCECTOMY Bilateral 11/21/2017   Procedure: Laminectomy and Foraminotomy - Lumbar Three-Lumbar Four - Lumbar Four-Lumbar Five - bilateral;  Surgeon: Eustace Moore, MD;  Location: [facility];  Service: Neurosurgery;  Laterality: Bilateral;  Laminectomy and Foraminotomy - Lumbar Three-Lumbar Four - Lumbar Four-Lumbar Five - bilateral  . THULIUM LASER TURP (TRANSURETHRAL RESECTION OF PROSTATE) N/A 02/14/2018   Procedure: THULIUM LASER TURP (TRANSURETHRAL RESECTION OF PROSTATE);  Surgeon: Festus Aloe, MD;  Location: [facility];  Service: Urology;  Laterality: N/A;    No current facility-administered medications for this encounter.    Current Outpatient Medications  Medication Sig Dispense Refill Last Dose  . finasteride (PROSCAR) 5 MG tablet Take 5 mg by mouth every morning.    02/13/2018 at Unknown time  . HYDROcodone-acetaminophen (NORCO) 7.5-325 MG tablet Take 1 tablet by mouth every 6 (six) hours. (Patient not taking: Reported on 01/30/2018) 30 tablet 0 Completed Course at Unknown time  . insulin lispro (HUMALOG) 100 UNIT/ML injection Inject 10 Units into the skin 2 (two) times  daily.     . metFORMIN (GLUCOPHAGE) 1000 MG tablet Take 1,000 mg by mouth daily with breakfast.   1 02/13/2018 at Unknown time  . naproxen sodium (ALEVE) 220 MG tablet Take 440 mg by mouth every morning.    02/13/2018 at Unknown time  . omeprazole (PRILOSEC) 20 MG capsule Take 20 mg by mouth every morning.    02/13/2018 at Unknown time  . pioglitazone (ACTOS) 30 MG tablet Take 30 mg by mouth every morning.    02/13/2018 at  Unknown time  . ramipril (ALTACE) 2.5 MG capsule Take 2.5 mg by mouth every morning.    02/13/2018 at Unknown time  . rosuvastatin (CRESTOR) 20 MG tablet Take 20 mg by mouth every morning.    02/13/2018 at Unknown time  . tamsulosin (FLOMAX) 0.4 MG CAPS capsule Take 1 capsule (0.4 mg total) by mouth daily after supper. (Patient not taking: Reported on 08/12/2018)   Not Taking at Unknown time  . TOUJEO SOLOSTAR 300 UNIT/ML SOPN Inject 60 Units as directed every morning.   2 02/13/2018 at Unknown time   Allergies  Allergen Reactions  . Peanut-Containing Drug Products Hives    Fever blisters around mouth  "just peanuts"    Social History   Tobacco Use  . Smoking status: Never Smoker  . Smokeless tobacco: Never Used  Substance Use Topics  . Alcohol use: No    Family History  Problem Relation Age of Onset  . Pulmonary fibrosis Mother   . Pancreatic cancer Sister        intraabdominal tumor  . Liver disease Father   . Alcoholism Father      Review of Systems  Constitutional: Negative for chills and fever.  HENT: Negative for congestion, sore throat and tinnitus.   Eyes: Negative for double vision, photophobia and pain.  Respiratory: Negative for cough, shortness of breath and wheezing.   Cardiovascular: Negative for chest pain, palpitations and orthopnea.  Gastrointestinal: Negative for heartburn, nausea and vomiting.  Genitourinary: Negative for dysuria, frequency and urgency.  Musculoskeletal: Positive for joint pain.  Neurological: Negative for dizziness, weakness and headaches.    Objective:  Physical Exam  Well nourished and well developed.  General: Alert and oriented x3, cooperative and pleasant, no acute distress.  Head: normocephalic, atraumatic, neck supple.  Eyes: EOMI.  Respiratory: breath sounds clear in all fields, no wheezing, rales, or rhonchi. Cardiovascular: Regular rate and rhythm, no murmurs, gallops or rubs.  Abdomen: non-tender to palpation and soft,  normoactive bowel sounds. Musculoskeletal:  Right Hip Exam: ROM: Flexion to 100, Internal Rotation 0, External Rotation 0, and Abduction 5 degrees.  There is no tenderness over the greater trochanter bursa.  There is no pain on provocative testing of the hip.  Calves soft and nontender. Motor function intact in LE. Strength 5/5 LE bilaterally. Neuro: Distal pulses 2+. Sensation to light touch intact in LE.  Vital signs in last 24 hours: Blood pressure: 114/64 mmHg Pulse: 100 bpm  Labs:   Estimated body mass index is 28.59 kg/m as calculated from the following:   Height as of 08/20/18: 5\' 8"  (1.727 m).   Weight as of 08/20/18: 85.3 kg.   Imaging Review Plain radiographs demonstrate severe degenerative joint disease of the right hip(s). The bone quality appears to be adequate for age and reported activity level.   Assessment/Plan:  End stage arthritis, right hip(s)  The patient history, physical examination, clinical judgement of the provider and imaging studies are consistent with end stage  degenerative joint disease of the right hip(s) and total hip arthroplasty is deemed medically necessary. The treatment options including medical management, injection therapy, arthroscopy and arthroplasty were discussed at length. The risks and benefits of total hip arthroplasty were presented and reviewed. The risks due to aseptic loosening, infection, stiffness, dislocation/subluxation,  thromboembolic complications and other imponderables were discussed.  The patient acknowledged the explanation, agreed to proceed with the plan and consent was signed. Patient is being admitted for inpatient treatment for surgery, pain control, PT, OT, prophylactic antibiotics, VTE prophylaxis, progressive ambulation and ADL's and discharge planning.The patient is planning to be discharged home.  Anticipated LOS equal to or greater than 2 midnights due to - Age 24 and older with one or more of the following:  -  Obesity  - Expected need for hospital services (PT, OT, Nursing) required for safe  discharge  - Anticipated need for postoperative skilled nursing care or inpatient rehab  - Active co-morbidities: Diabetes OR   - Unanticipated findings during/Post Surgery: None  - Patient is a high risk of re-admission due to: None  Therapy Plans: HEP Disposition: Home with daughter Planned DVT Prophylaxis: Aspirin 325 mg BID DME needed: None PCP: Crist Infante, MD TXA: IV Allergies: NKDA Anesthesia Concerns: None BMI: 29.6 Last HgbA1c: 6.3%  - Patient was instructed on what medications to stop prior to surgery. - Follow-up visit in 2 weeks with Dr. Wynelle Link - Begin physical therapy following surgery - Pre-operative lab work as pre-surgical testing - Prescriptions will be provided in hospital at time of discharge  Theresa Duty, PA-C Orthopedic Surgery [facility] [place]

## 2018-12-12 DIAGNOSIS — R300 Dysuria: Secondary | ICD-10-CM | POA: Diagnosis not present

## 2018-12-12 DIAGNOSIS — N3001 Acute cystitis with hematuria: Secondary | ICD-10-CM | POA: Diagnosis not present

## 2018-12-12 DIAGNOSIS — R310 Gross hematuria: Secondary | ICD-10-CM | POA: Diagnosis not present

## 2018-12-23 NOTE — Patient Instructions (Addendum)
Zachary Vaughn   Your procedure is scheduled on: Wednesday 12/31/2018   Report to [facility] Main  Entrance Report to Short Stay at   06:30 AM              YOU NEED TO HAVE A COVID 19 TEST ON_Sat 6/27______ @_______ ,             THIS TEST MUST BE DONE BEFORE SURGERY, COME TO [place].              ONCE YOUR COVID TEST IS COMPLETED, PLEASE BEGIN THE QUARANTINE INSTRUCTIONS AS OUTLINED IN YOUR HANDOUT.   Call this number if you have problems the morning of surgery [phone]    How to Manage Your Diabetes Before and After Surgery  Why is it important to control my blood sugar before and after surgery? . Improving blood sugar levels before and after surgery helps healing and can limit problems. . A way of improving blood sugar control is eating a healthy diet by: o  Eating less sugar and carbohydrates o  Increasing activity/exercise o  Talking with your doctor about reaching your blood sugar goals . High blood sugars (greater than 180 mg/dL) can raise your risk of infections and slow your recovery, so you will need to focus on controlling your diabetes during the weeks before surgery. . Make sure that the doctor who takes care of your diabetes knows about your planned surgery including the date and location.  How do I manage my blood sugar before surgery? . Check your blood sugar at least 4 times a day, starting 2 days before surgery, to make sure that the level is not too high or low. o Check your blood sugar the morning of your surgery when you wake up and every 2 hours until you get to the Short Stay unit. . If your blood sugar is less than 70 mg/dL, you will need to treat for low blood sugar: o Do not take insulin. o Treat a low blood sugar (less than 70 mg/dL) with  cup of clear juice (cranberry or apple), 4 glucose tablets, OR glucose gel. o Recheck blood sugar in 15 minutes after treatment (to make sure it is  greater than 70 mg/dL). If your blood sugar is not greater than 70 mg/dL on recheck, call [phone] for further instructions. . Report your blood sugar to the short stay nurse when you get to Short Stay.  . If you are admitted to the hospital after surgery: o Your blood sugar will be checked by the staff and you will probably be given insulin after surgery (instead of oral diabetes medicines) to make sure you have good blood sugar levels. o The goal for blood sugar control after surgery is 80-180 mg/dL.   WHAT DO I DO ABOUT MY DIABETES MEDICATION?          The day before surgery, Take Metformin (Glucophage as usual!           The day before surgery, Take Pioglitazone (Actos) as usual!         The day before surgery, Take Toujeo Solostar Insulin as usual!           The day before surgery,Take the MORNING DOSE ONLY of Humalog Insulin ! DO NOT TAKE EVENING DOSE !       . Do not take oral diabetes medicines (pills)  the morning of surgery!      . THE MORNING OF SURGERY, take  22  units of      Toujeo Solostar   insulin.  . If your CBG is greater than 220 mg/dL, you may take  of your sliding scale  . (correction) dose of insulin.                   NO SOLID FOOD AFTER MIDNIGHT THE NIGHT PRIOR TO SURGERY . NOTHING BY MOUTH EXCEPT CLEAR LIQUIDS UNTIL 3 HOURS PRIOR TO [place] SURGERY.              PLEASE FINISH  Gatorade G 2  DRINK PER SURGEON ORDER 3 HOURS PRIOR TO SCHEDULED SURGERY TIME WHICH NEEDS TO BE COMPLETED AT   05:30 am.   CLEAR LIQUID DIET   Foods Allowed                                                                     Foods Excluded  Coffee and tea, regular and decaf                             liquids that you cannot  Plain Jell-O in any flavor                                             see through such as: Fruit ices (not with fruit pulp)                                     milk, soups, orange juice  Iced Popsicles                                    All solid  food Carbonated beverages, regular and diet                                    Cranberry, grape and apple juices Sports drinks like Gatorade Lightly seasoned clear broth or consume(fat free) Sugar, honey syrup  Sample Menu Breakfast                                Lunch                                     Supper Cranberry juice                    Beef broth                            Chicken broth Jell-O  Grape juice                           Apple juice Coffee or tea                        Jell-O                                      Popsicle                                                Coffee or tea                        Coffee or tea  _____________________________________________________________________                BRUSH YOUR TEETH MORNING OF SURGERY AND RINSE YOUR MOUTH OUT, NO CHEWING GUM CANDY OR MINTS.     Take these medicines the morning of surgery with A SIP OF WATER: Omeprazole (Prilosec), Rosuvastatin (Crestor)               DO NOT TAKE ANY DIABETIC MEDICATIONS DAY OF YOUR SURGERY!                               You may not have any metal on your body including hair pins and              piercings  Do not wear jewelry, , lotions, powders or, deodorant                      Men may shave face and neck.   Do not bring valuables to the hospital. [place].  Contacts, dentures or bridgework may not be worn into surgery.                   Please read over the following fact sheets you were given: _____________________________________________________________________             [facility] - Preparing for Surgery Before surgery, you can play an important role.  Because skin is not sterile, your skin needs to be as free of germs as possible.  You can reduce the number of germs on your skin by washing with CHG (chlorahexidine gluconate) soap before surgery.  CHG is an antiseptic  cleaner which kills germs and bonds with the skin to continue killing germs even after washing. Please DO NOT use if you have an allergy to CHG or antibacterial soaps.  If your skin becomes reddened/irritated stop using the CHG and inform your nurse when you arrive at Short Stay. Do not shave (including legs and underarms) for at least 48 hours prior to the first CHG shower.  You may shave your face/neck. Please follow these instructions carefully:  1.  Shower with CHG Soap the night before surgery and the  morning of Surgery.  2.  If you choose to wash your hair, wash your hair first as usual with your  normal  shampoo.  3.  After you shampoo, rinse your hair and body thoroughly to remove the  shampoo.                           4.  Use CHG as you would any other liquid soap.  You can apply chg directly  to the skin and wash                       Gently with a scrungie or clean washcloth.  5.  Apply the CHG Soap to your body ONLY FROM THE NECK DOWN.   Do not use on face/ open                           Wound or open sores. Avoid contact with eyes, ears mouth and genitals (private parts).                       Wash face,  Genitals (private parts) with your normal soap.             6.  Wash thoroughly, paying special attention to the area where your surgery  will be performed.  7.  Thoroughly rinse your body with warm water from the neck down.  8.  DO NOT shower/wash with your normal soap after using and rinsing off  the CHG Soap.                9.  Pat yourself dry with a clean towel.            10.  Wear clean pajamas.            11.  Place clean sheets on your bed the night of your first shower and do not  sleep with pets. Day of Surgery : Do not apply any lotions/deodorants the morning of surgery.  Please wear clean clothes to the hospital/surgery center.  FAILURE TO FOLLOW THESE INSTRUCTIONS MAY RESULT IN THE CANCELLATION OF YOUR SURGERY PATIENT  SIGNATURE_________________________________  NURSE SIGNATURE__________________________________  ________________________________________________________________________   Adam Phenix  An incentive spirometer is a tool that can help keep your lungs clear and active. This tool measures how well you are filling your lungs with each breath. Taking long deep breaths may help reverse or decrease the chance of developing breathing (pulmonary) problems (especially infection) following:  A long period of time when you are unable to move or be active. BEFORE THE PROCEDURE   If the spirometer includes an indicator to show your best effort, your nurse or respiratory therapist will set it to a desired goal.  If possible, sit up straight or lean slightly forward. Try not to slouch.  Hold the incentive spirometer in an upright position. INSTRUCTIONS FOR USE  1. Sit on the edge of your bed if possible, or sit up as far as you can in bed or on a chair. 2. Hold the incentive spirometer in an upright position. 3. Breathe out normally. 4. Place the mouthpiece in your mouth and seal your lips tightly around it. 5. Breathe in slowly and as deeply as possible, raising the piston or the ball toward the top of the column. 6. Hold your breath for 3-5 seconds or for as long as possible. Allow the piston or ball to fall to the bottom of the column. 7. Remove the mouthpiece from your mouth and breathe out normally. 8. Rest  for a few seconds and repeat Steps 1 through 7 at least 10 times every 1-2 hours when you are awake. Take your time and take a few normal breaths between deep breaths. 9. The spirometer may include an indicator to show your best effort. Use the indicator as a goal to work toward during each repetition. 10. After each set of 10 deep breaths, practice coughing to be sure your lungs are clear. If you have an incision (the cut made at the time of surgery), support your incision when coughing  by placing a pillow or rolled up towels firmly against it. Once you are able to get out of bed, walk around indoors and cough well. You may stop using the incentive spirometer when instructed by your caregiver.  RISKS AND COMPLICATIONS  Take your time so you do not get dizzy or light-headed.  If you are in pain, you may need to take or ask for pain medication before doing incentive spirometry. It is harder to take a deep breath if you are having pain. AFTER USE  Rest and breathe slowly and easily.  It can be helpful to keep track of a log of your progress. Your caregiver can provide you with a simple table to help with this. If you are using the spirometer at home, follow these instructions: [place] IF:   You are having difficultly using the spirometer.  You have trouble using the spirometer as often as instructed.  Your pain medication is not giving enough relief while using the spirometer.  You develop fever of 100.5 F (38.1 C) or higher. SEEK IMMEDIATE MEDICAL CARE IF:   You cough up bloody sputum that had not been present before.  You develop fever of 102 F (38.9 C) or greater.  You develop worsening pain at or near the incision site. MAKE SURE YOU:   Understand these instructions.  Will watch your condition.  Will get help right away if you are not doing well or get worse. Document Released: 10/29/2006 Document Revised: 09/10/2011 Document Reviewed: 12/30/2006 Exit[employer] Patient Information 2014 [employer], [place].   ________________________________________________________________________  WHAT IS A BLOOD TRANSFUSION? Blood Transfusion Information  A transfusion is the replacement of blood or some of its parts. Blood is made up of multiple cells which provide different functions.  Red blood cells carry oxygen and are used for blood loss replacement.  White blood cells fight against infection.  Platelets control bleeding.  Plasma helps clot  blood.  Other blood products are available for specialized needs, such as hemophilia or other clotting disorders. BEFORE THE TRANSFUSION  Who gives blood for transfusions?   Healthy volunteers who are fully evaluated to make sure their blood is safe. This is blood bank blood. Transfusion therapy is the safest it has ever been in the practice of medicine. Before blood is taken from a donor, a complete history is taken to make sure that person has no history of diseases nor engages in risky social behavior (examples are intravenous drug use or sexual activity with multiple partners). The donor's travel history is screened to minimize risk of transmitting infections, such as malaria. The donated blood is tested for signs of infectious diseases, such as HIV and hepatitis. The blood is then tested to be sure it is compatible with you in order to minimize the chance of a transfusion reaction. If you or a relative donates blood, this is often done in anticipation of surgery and is not appropriate for emergency situations. It takes many  days to process the donated blood. RISKS AND COMPLICATIONS Although transfusion therapy is very safe and saves many lives, the main dangers of transfusion include:   Getting an infectious disease.  Developing a transfusion reaction. This is an allergic reaction to something in the blood you were given. Every precaution is taken to prevent this. The decision to have a blood transfusion has been considered carefully by your caregiver before blood is given. Blood is not given unless the benefits outweigh the risks. AFTER THE TRANSFUSION  Right after receiving a blood transfusion, you will usually feel much better and more energetic. This is especially true if your red blood cells have gotten low (anemic). The transfusion raises the level of the red blood cells which carry oxygen, and this usually causes an energy increase.  The nurse administering the transfusion will monitor  you carefully for complications. HOME CARE INSTRUCTIONS  No special instructions are needed after a transfusion. You may find your energy is better. Speak with your caregiver about any limitations on activity for underlying diseases you may have. SEEK MEDICAL CARE IF:   Your condition is not improving after your transfusion.  You develop redness or irritation at the intravenous (IV) site. SEEK IMMEDIATE MEDICAL CARE IF:  Any of the following symptoms occur over the next 12 hours:  Shaking chills.  You have a temperature by mouth above 102 F (38.9 C), not controlled by medicine.  Chest, back, or muscle pain.  People around you feel you are not acting correctly or are confused.  Shortness of breath or difficulty breathing.  Dizziness and fainting.  You get a rash or develop hives.  You have a decrease in urine output.  Your urine turns a dark color or changes to pink, red, or brown. Any of the following symptoms occur over the next 10 days:  You have a temperature by mouth above 102 F (38.9 C), not controlled by medicine.  Shortness of breath.  Weakness after normal activity.  The white part of the eye turns yellow (jaundice).  You have a decrease in the amount of urine or are urinating less often.  Your urine turns a dark color or changes to pink, red, or brown. Document Released: 06/15/2000 Document Revised: 09/10/2011 Document Reviewed: 02/02/2008 [facility] Patient Information 2014 [place], [place].  _______________________________________________________________________

## 2018-12-24 ENCOUNTER — Other Ambulatory Visit: Payer: Self-pay

## 2018-12-24 ENCOUNTER — Encounter (HOSPITAL_COMMUNITY)
Admission: RE | Admit: 2018-12-24 | Discharge: 2018-12-24 | Disposition: A | Discharge status: Home / Self Care | Payer: Medicare Other | Source: Ambulatory Visit | Attending: Orthopedic Surgery | Admitting: Orthopedic Surgery

## 2018-12-24 ENCOUNTER — Encounter (HOSPITAL_COMMUNITY): Payer: Self-pay

## 2018-12-24 DIAGNOSIS — Z1159 Encounter for screening for other viral diseases: Secondary | ICD-10-CM | POA: Diagnosis not present

## 2018-12-24 DIAGNOSIS — Z01818 Encounter for other preprocedural examination: Secondary | ICD-10-CM | POA: Insufficient documentation

## 2018-12-24 DIAGNOSIS — M1611 Unilateral primary osteoarthritis, right hip: Secondary | ICD-10-CM | POA: Diagnosis not present

## 2018-12-24 LAB — COMPREHENSIVE METABOLIC PANEL
ALT: 15 U/L (ref 0–44)
AST: 18 U/L (ref 15–41)
Albumin: 4.1 g/dL (ref 3.5–5.0)
Alkaline Phosphatase: 82 U/L (ref 38–126)
Anion gap: 8 (ref 5–15)
BUN: 20 mg/dL (ref 8–23)
CO2: 24 mmol/L (ref 22–32)
Calcium: 9.1 mg/dL (ref 8.9–10.3)
Chloride: 109 mmol/L (ref 98–111)
Creatinine, Ser: 0.92 mg/dL (ref 0.61–1.24)
GFR calc Af Amer: >60 mL/min (ref >60)
GFR calc non Af Amer: >60 mL/min (ref >60)
Glucose, Bld: 130 mg/dL — ABNORMAL HIGH (ref 70–99)
Potassium: 4 mmol/L (ref 3.5–5.1)
Sodium: 141 mmol/L (ref 135–145)
Total Bilirubin: 1 mg/dL (ref 0.3–1.2)
Total Protein: 6.6 g/dL (ref 6.5–8.1)

## 2018-12-24 LAB — CBC
HCT: 42.7 % (ref 39.0–52.0)
Hemoglobin: 13.5 g/dL (ref 13.0–17.0)
MCH: 28.8 pg (ref 26.0–34.0)
MCHC: 31.6 g/dL (ref 30.0–36.0)
MCV: 91 fL (ref 80.0–100.0)
Platelets: 181 10*3/uL (ref 150–400)
RBC: 4.69 MIL/uL (ref 4.22–5.81)
RDW: 15.3 % (ref 11.5–15.5)
WBC: 7.3 10*3/uL (ref 4.0–10.5)
nRBC: 0 % (ref 0.0–0.2)

## 2018-12-24 LAB — SURGICAL PCR SCREEN
MRSA, PCR: NEGATIVE
Staphylococcus aureus: NEGATIVE

## 2018-12-24 LAB — GLUCOSE, CAPILLARY: Glucose-Capillary: 164 mg/dL — ABNORMAL HIGH (ref 70–99)

## 2018-12-24 LAB — APTT: aPTT: 29 seconds (ref 24–36)

## 2018-12-24 LAB — PROTIME-INR
INR: 1.1 (ref 0.8–1.2)
Prothrombin Time: 14.2 seconds (ref 11.4–15.2)

## 2018-12-27 ENCOUNTER — Other Ambulatory Visit (HOSPITAL_COMMUNITY)
Admission: RE | Admit: 2018-12-27 | Discharge: 2018-12-27 | Disposition: A | Discharge status: Home / Self Care | Payer: Medicare Other | Source: Ambulatory Visit | Attending: Orthopedic Surgery | Admitting: Orthopedic Surgery

## 2018-12-27 DIAGNOSIS — Z1159 Encounter for screening for other viral diseases: Secondary | ICD-10-CM | POA: Diagnosis not present

## 2018-12-27 DIAGNOSIS — Z01818 Encounter for other preprocedural examination: Secondary | ICD-10-CM | POA: Diagnosis not present

## 2018-12-27 DIAGNOSIS — M1611 Unilateral primary osteoarthritis, right hip: Secondary | ICD-10-CM | POA: Diagnosis not present

## 2018-12-27 LAB — SARS CORONAVIRUS 2 (TAT 6-24 HRS): SARS Coronavirus 2: NEGATIVE

## 2018-12-31 ENCOUNTER — Inpatient Hospital Stay (HOSPITAL_COMMUNITY): Payer: Medicare Other | Admitting: Certified Registered Nurse Anesthetist

## 2018-12-31 ENCOUNTER — Inpatient Hospital Stay (HOSPITAL_COMMUNITY): Payer: Medicare Other

## 2018-12-31 ENCOUNTER — Encounter (HOSPITAL_COMMUNITY)
Admission: RE | Disposition: A | Discharge status: Home / Self Care | Payer: Self-pay | Source: Home / Self Care | Attending: Orthopedic Surgery

## 2018-12-31 ENCOUNTER — Other Ambulatory Visit: Payer: Self-pay

## 2018-12-31 ENCOUNTER — Inpatient Hospital Stay (HOSPITAL_COMMUNITY)
Admission: RE | Admit: 2018-12-31 | Discharge: 2019-01-01 | DRG: 470 | Disposition: A | Discharge status: Home / Self Care | Payer: Medicare Other | Attending: Orthopedic Surgery | Admitting: Orthopedic Surgery

## 2018-12-31 ENCOUNTER — Encounter (HOSPITAL_COMMUNITY): Payer: Self-pay | Admitting: General Practice

## 2018-12-31 ENCOUNTER — Inpatient Hospital Stay (HOSPITAL_COMMUNITY): Payer: Medicare Other | Admitting: Physician Assistant

## 2018-12-31 DIAGNOSIS — K219 Gastro-esophageal reflux disease without esophagitis: Secondary | ICD-10-CM | POA: Diagnosis not present

## 2018-12-31 DIAGNOSIS — I10 Essential (primary) hypertension: Secondary | ICD-10-CM | POA: Diagnosis not present

## 2018-12-31 DIAGNOSIS — M810 Age-related osteoporosis without current pathological fracture: Secondary | ICD-10-CM | POA: Diagnosis not present

## 2018-12-31 DIAGNOSIS — N400 Enlarged prostate without lower urinary tract symptoms: Secondary | ICD-10-CM | POA: Diagnosis not present

## 2018-12-31 DIAGNOSIS — E785 Hyperlipidemia, unspecified: Secondary | ICD-10-CM | POA: Diagnosis present

## 2018-12-31 DIAGNOSIS — Z9079 Acquired absence of other genital organ(s): Secondary | ICD-10-CM

## 2018-12-31 DIAGNOSIS — Z79899 Other long term (current) drug therapy: Secondary | ICD-10-CM | POA: Diagnosis not present

## 2018-12-31 DIAGNOSIS — M1611 Unilateral primary osteoarthritis, right hip: Principal | ICD-10-CM | POA: Diagnosis present

## 2018-12-31 DIAGNOSIS — Z419 Encounter for procedure for purposes other than remedying health state, unspecified: Secondary | ICD-10-CM

## 2018-12-31 DIAGNOSIS — E119 Type 2 diabetes mellitus without complications: Secondary | ICD-10-CM | POA: Diagnosis not present

## 2018-12-31 DIAGNOSIS — Z1159 Encounter for screening for other viral diseases: Secondary | ICD-10-CM | POA: Diagnosis not present

## 2018-12-31 DIAGNOSIS — Z96641 Presence of right artificial hip joint: Secondary | ICD-10-CM | POA: Diagnosis not present

## 2018-12-31 DIAGNOSIS — Z471 Aftercare following joint replacement surgery: Secondary | ICD-10-CM | POA: Diagnosis not present

## 2018-12-31 DIAGNOSIS — Z96649 Presence of unspecified artificial hip joint: Secondary | ICD-10-CM

## 2018-12-31 DIAGNOSIS — M25751 Osteophyte, right hip: Secondary | ICD-10-CM | POA: Diagnosis present

## 2018-12-31 DIAGNOSIS — Z794 Long term (current) use of insulin: Secondary | ICD-10-CM | POA: Diagnosis not present

## 2018-12-31 DIAGNOSIS — E669 Obesity, unspecified: Secondary | ICD-10-CM | POA: Diagnosis present

## 2018-12-31 DIAGNOSIS — Z6826 Body mass index (BMI) 26.0-26.9, adult: Secondary | ICD-10-CM | POA: Diagnosis not present

## 2018-12-31 DIAGNOSIS — M169 Osteoarthritis of hip, unspecified: Secondary | ICD-10-CM

## 2018-12-31 DIAGNOSIS — Z9181 History of falling: Secondary | ICD-10-CM

## 2018-12-31 HISTORY — PX: TOTAL HIP ARTHROPLASTY: SHX124

## 2018-12-31 LAB — TYPE AND SCREEN
ABO/RH(D): O POS
Antibody Screen: NEGATIVE

## 2018-12-31 LAB — GLUCOSE, CAPILLARY
Glucose-Capillary: 112 mg/dL — ABNORMAL HIGH (ref 70–99)
Glucose-Capillary: 72 mg/dL (ref 70–99)
Glucose-Capillary: 239 mg/dL — ABNORMAL HIGH (ref 70–99)

## 2018-12-31 SURGERY — ARTHROPLASTY, HIP, TOTAL, ANTERIOR APPROACH
Anesthesia: Spinal | Site: Hip | Laterality: Right

## 2018-12-31 MED ORDER — MORPHINE SULFATE (PF) 2 MG/ML IV SOLN: 0.5000 mg | INTRAVENOUS | Status: DC | PRN

## 2018-12-31 MED ORDER — MIDAZOLAM HCL 5 MG/5ML IJ SOLN
INTRAMUSCULAR | Status: DC | PRN
  Administered 2018-12-31: 1 mg via INTRAVENOUS

## 2018-12-31 MED ORDER — PHENYLEPHRINE 40 MCG/ML (10ML) SYRINGE FOR IV PUSH (FOR BLOOD PRESSURE SUPPORT)
PREFILLED_SYRINGE | INTRAVENOUS | Status: DC | PRN
  Administered 2018-12-31: 40 ug via INTRAVENOUS
  Administered 2018-12-31 (×3): 80 ug via INTRAVENOUS

## 2018-12-31 MED ORDER — POLYETHYLENE GLYCOL 3350 17 G PO PACK: 17.0000 g | PACK | Freq: Every day | ORAL | Status: DC | PRN

## 2018-12-31 MED ORDER — ONDANSETRON HCL 4 MG PO TABS: 4.0000 mg | ORAL_TABLET | Freq: Four times a day (QID) | ORAL | Status: DC | PRN

## 2018-12-31 MED ORDER — METHOCARBAMOL 500 MG PO TABS
500.0000 mg | ORAL_TABLET | Freq: Four times a day (QID) | ORAL | Status: DC | PRN
  Administered 2019-01-01: 500 mg via ORAL
  Filled 2018-12-31: qty 1

## 2018-12-31 MED ORDER — TRAMADOL HCL 50 MG PO TABS
50.0000 mg | ORAL_TABLET | Freq: Four times a day (QID) | ORAL | Status: DC | PRN
  Administered 2018-12-31 – 2019-01-01 (×2): 100 mg via ORAL
  Filled 2018-12-31 (×2): qty 2

## 2018-12-31 MED ORDER — MIDAZOLAM HCL 2 MG/2ML IJ SOLN
INTRAMUSCULAR | Status: AC
  Filled 2018-12-31: qty 2

## 2018-12-31 MED ORDER — PROPOFOL 10 MG/ML IV BOLUS
INTRAVENOUS | Status: DC | PRN
  Administered 2018-12-31 (×5): 10 mg via INTRAVENOUS

## 2018-12-31 MED ORDER — PROPOFOL 10 MG/ML IV BOLUS
INTRAVENOUS | Status: AC
  Filled 2018-12-31: qty 60

## 2018-12-31 MED ORDER — FENTANYL CITRATE (PF) 100 MCG/2ML IJ SOLN
INTRAMUSCULAR | Status: AC
  Filled 2018-12-31: qty 2

## 2018-12-31 MED ORDER — CEFAZOLIN SODIUM-DEXTROSE 2-4 GM/100ML-% IV SOLN
2.0000 g | Freq: Four times a day (QID) | INTRAVENOUS | Status: AC
  Administered 2018-12-31 (×2): 2 g via INTRAVENOUS
  Filled 2018-12-31 (×2): qty 100

## 2018-12-31 MED ORDER — DEXAMETHASONE SODIUM PHOSPHATE 10 MG/ML IJ SOLN
INTRAMUSCULAR | Status: DC | PRN
  Administered 2018-12-31: 10 mg via INTRAVENOUS

## 2018-12-31 MED ORDER — OXYCODONE HCL 5 MG/5ML PO SOLN: 5.0000 mg | Freq: Once | ORAL | Status: DC | PRN

## 2018-12-31 MED ORDER — INSULIN GLARGINE 100 UNIT/ML ~~LOC~~ SOLN
45.0000 [IU] | Freq: Every day | SUBCUTANEOUS | Status: DC
  Administered 2019-01-01: 45 [IU] via SUBCUTANEOUS
  Filled 2018-12-31 (×2): qty 0.45

## 2018-12-31 MED ORDER — ONDANSETRON HCL 4 MG/2ML IJ SOLN: 4.0000 mg | Freq: Once | INTRAMUSCULAR | Status: DC | PRN

## 2018-12-31 MED ORDER — INSULIN ASPART 100 UNIT/ML ~~LOC~~ SOLN
0.0000 [IU] | Freq: Three times a day (TID) | SUBCUTANEOUS | Status: DC
  Administered 2018-12-31: 8 [IU] via SUBCUTANEOUS
  Administered 2019-01-01 (×2): 3 [IU] via SUBCUTANEOUS

## 2018-12-31 MED ORDER — POVIDONE-IODINE 10 % EX SWAB: 2.0000 "application " | Freq: Once | CUTANEOUS | Status: DC

## 2018-12-31 MED ORDER — ROSUVASTATIN CALCIUM 20 MG PO TABS
20.0000 mg | ORAL_TABLET | Freq: Every morning | ORAL | Status: DC
  Administered 2019-01-01: 20 mg via ORAL
  Filled 2018-12-31: qty 1

## 2018-12-31 MED ORDER — METHOCARBAMOL 500 MG IVPB - SIMPLE MED
500.0000 mg | Freq: Four times a day (QID) | INTRAVENOUS | Status: DC | PRN
  Filled 2018-12-31: qty 50

## 2018-12-31 MED ORDER — BUPIVACAINE-EPINEPHRINE (PF) 0.25% -1:200000 IJ SOLN
INTRAMUSCULAR | Status: AC
  Filled 2018-12-31: qty 30

## 2018-12-31 MED ORDER — DEXAMETHASONE SODIUM PHOSPHATE 10 MG/ML IJ SOLN
10.0000 mg | Freq: Once | INTRAMUSCULAR | Status: AC
  Administered 2019-01-01: 10 mg via INTRAVENOUS
  Filled 2018-12-31: qty 1

## 2018-12-31 MED ORDER — FLEET ENEMA 7-19 GM/118ML RE ENEM: 1.0000 | ENEMA | Freq: Once | RECTAL | Status: DC | PRN

## 2018-12-31 MED ORDER — OXYCODONE HCL 5 MG PO TABS: 5.0000 mg | ORAL_TABLET | Freq: Once | ORAL | Status: DC | PRN

## 2018-12-31 MED ORDER — INSULIN ASPART 100 UNIT/ML ~~LOC~~ SOLN
10.0000 [IU] | Freq: Two times a day (BID) | SUBCUTANEOUS | Status: DC
  Administered 2018-12-31 – 2019-01-01 (×2): 10 [IU] via SUBCUTANEOUS

## 2018-12-31 MED ORDER — FENTANYL CITRATE (PF) 100 MCG/2ML IJ SOLN
INTRAMUSCULAR | Status: DC | PRN
  Administered 2018-12-31 (×2): 50 ug via INTRAVENOUS

## 2018-12-31 MED ORDER — ONDANSETRON HCL 4 MG/2ML IJ SOLN
INTRAMUSCULAR | Status: AC
  Filled 2018-12-31: qty 2

## 2018-12-31 MED ORDER — PROPOFOL 500 MG/50ML IV EMUL
INTRAVENOUS | Status: DC | PRN
  Administered 2018-12-31: 50 ug/kg/min via INTRAVENOUS

## 2018-12-31 MED ORDER — METOCLOPRAMIDE HCL 5 MG/ML IJ SOLN: 5.0000 mg | Freq: Three times a day (TID) | INTRAMUSCULAR | Status: DC | PRN

## 2018-12-31 MED ORDER — INSULIN GLARGINE (1 UNIT DIAL) 300 UNIT/ML ~~LOC~~ SOPN: 45.0000 [IU] | PEN_INJECTOR | Freq: Every morning | SUBCUTANEOUS | Status: DC

## 2018-12-31 MED ORDER — ASPIRIN EC 325 MG PO TBEC
325.0000 mg | DELAYED_RELEASE_TABLET | Freq: Two times a day (BID) | ORAL | Status: DC
  Administered 2019-01-01: 325 mg via ORAL
  Filled 2018-12-31: qty 1

## 2018-12-31 MED ORDER — LACTATED RINGERS IV SOLN
INTRAVENOUS | Status: DC
  Administered 2018-12-31 (×3): via INTRAVENOUS

## 2018-12-31 MED ORDER — TRANEXAMIC ACID-NACL 1000-0.7 MG/100ML-% IV SOLN
1000.0000 mg | Freq: Once | INTRAVENOUS | Status: AC
  Administered 2018-12-31: 1000 mg via INTRAVENOUS
  Filled 2018-12-31 (×2): qty 100

## 2018-12-31 MED ORDER — PIOGLITAZONE HCL 30 MG PO TABS
30.0000 mg | ORAL_TABLET | Freq: Every day | ORAL | Status: DC
  Administered 2019-01-01: 30 mg via ORAL
  Filled 2018-12-31: qty 1

## 2018-12-31 MED ORDER — MENTHOL 3 MG MT LOZG: 1.0000 | LOZENGE | OROMUCOSAL | Status: DC | PRN

## 2018-12-31 MED ORDER — TRANEXAMIC ACID-NACL 1000-0.7 MG/100ML-% IV SOLN
1000.0000 mg | INTRAVENOUS | Status: AC
  Administered 2018-12-31: 1000 mg via INTRAVENOUS
  Filled 2018-12-31: qty 100

## 2018-12-31 MED ORDER — INSULIN LISPRO 100 UNIT/ML ~~LOC~~ SOLN: 10.0000 [IU] | Freq: Two times a day (BID) | SUBCUTANEOUS | Status: DC

## 2018-12-31 MED ORDER — BISACODYL 10 MG RE SUPP: 10.0000 mg | Freq: Every day | RECTAL | Status: DC | PRN

## 2018-12-31 MED ORDER — PANTOPRAZOLE SODIUM 40 MG PO TBEC
40.0000 mg | DELAYED_RELEASE_TABLET | Freq: Every day | ORAL | Status: DC
  Administered 2019-01-01: 40 mg via ORAL
  Filled 2018-12-31: qty 1

## 2018-12-31 MED ORDER — FINASTERIDE 5 MG PO TABS
5.0000 mg | ORAL_TABLET | Freq: Every morning | ORAL | Status: DC
  Administered 2019-01-01: 5 mg via ORAL
  Filled 2018-12-31: qty 1

## 2018-12-31 MED ORDER — PHENOL 1.4 % MT LIQD
1.0000 | OROMUCOSAL | Status: DC | PRN
  Filled 2018-12-31: qty 177

## 2018-12-31 MED ORDER — ONDANSETRON HCL 4 MG/2ML IJ SOLN
INTRAMUSCULAR | Status: DC | PRN
  Administered 2018-12-31: 4 mg via INTRAVENOUS

## 2018-12-31 MED ORDER — ACETAMINOPHEN 10 MG/ML IV SOLN
1000.0000 mg | Freq: Four times a day (QID) | INTRAVENOUS | Status: DC
  Administered 2018-12-31: 1000 mg via INTRAVENOUS
  Filled 2018-12-31: qty 100

## 2018-12-31 MED ORDER — CHLORHEXIDINE GLUCONATE 4 % EX LIQD: 60.0000 mL | Freq: Once | CUTANEOUS | Status: DC

## 2018-12-31 MED ORDER — SODIUM CHLORIDE 0.9 % IR SOLN
Status: DC | PRN
  Administered 2018-12-31: 1000 mL

## 2018-12-31 MED ORDER — DOCUSATE SODIUM 100 MG PO CAPS
100.0000 mg | ORAL_CAPSULE | Freq: Two times a day (BID) | ORAL | Status: DC
  Administered 2018-12-31 – 2019-01-01 (×2): 100 mg via ORAL
  Filled 2018-12-31 (×2): qty 1

## 2018-12-31 MED ORDER — STERILE WATER FOR IRRIGATION IR SOLN
Status: DC | PRN
  Administered 2018-12-31 (×2): 1000 mL

## 2018-12-31 MED ORDER — BUPIVACAINE IN DEXTROSE 0.75-8.25 % IT SOLN
INTRATHECAL | Status: DC | PRN
  Administered 2018-12-31: 1.8 mL via INTRATHECAL

## 2018-12-31 MED ORDER — METOCLOPRAMIDE HCL 5 MG PO TABS: 5.0000 mg | ORAL_TABLET | Freq: Three times a day (TID) | ORAL | Status: DC | PRN

## 2018-12-31 MED ORDER — ACETAMINOPHEN 500 MG PO TABS
500.0000 mg | ORAL_TABLET | Freq: Four times a day (QID) | ORAL | Status: DC
  Administered 2018-12-31 (×2): 500 mg via ORAL
  Filled 2018-12-31 (×4): qty 1

## 2018-12-31 MED ORDER — SODIUM CHLORIDE 0.9 % IV SOLN
INTRAVENOUS | Status: DC
  Administered 2018-12-31 – 2019-01-01 (×2): via INTRAVENOUS

## 2018-12-31 MED ORDER — FENTANYL CITRATE (PF) 100 MCG/2ML IJ SOLN: 25.0000 ug | INTRAMUSCULAR | Status: DC | PRN

## 2018-12-31 MED ORDER — CEFAZOLIN SODIUM-DEXTROSE 2-4 GM/100ML-% IV SOLN
2.0000 g | INTRAVENOUS | Status: AC
  Administered 2018-12-31: 2 g via INTRAVENOUS
  Filled 2018-12-31: qty 100

## 2018-12-31 MED ORDER — HYDROCODONE-ACETAMINOPHEN 5-325 MG PO TABS
1.0000 | ORAL_TABLET | ORAL | Status: DC | PRN
  Administered 2018-12-31 – 2019-01-01 (×2): 1 via ORAL
  Administered 2019-01-01: 2 via ORAL
  Filled 2018-12-31: qty 1
  Filled 2018-12-31 (×2): qty 2
  Filled 2018-12-31: qty 1

## 2018-12-31 MED ORDER — DEXAMETHASONE SODIUM PHOSPHATE 10 MG/ML IJ SOLN
INTRAMUSCULAR | Status: AC
  Filled 2018-12-31: qty 1

## 2018-12-31 MED ORDER — ONDANSETRON HCL 4 MG/2ML IJ SOLN: 4.0000 mg | Freq: Four times a day (QID) | INTRAMUSCULAR | Status: DC | PRN

## 2018-12-31 MED ORDER — BUPIVACAINE-EPINEPHRINE (PF) 0.25% -1:200000 IJ SOLN
INTRAMUSCULAR | Status: DC | PRN
  Administered 2018-12-31: 30 mL via PERINEURAL

## 2018-12-31 MED ORDER — RAMIPRIL 2.5 MG PO CAPS
2.5000 mg | ORAL_CAPSULE | Freq: Every morning | ORAL | Status: DC
  Administered 2019-01-01: 2.5 mg via ORAL
  Filled 2018-12-31 (×2): qty 1

## 2018-12-31 MED ORDER — DIPHENHYDRAMINE HCL 12.5 MG/5ML PO ELIX: 12.5000 mg | ORAL_SOLUTION | ORAL | Status: DC | PRN

## 2018-12-31 SURGICAL SUPPLY — 46 items
BAG DECANTER FOR FLEXI CONT (MISCELLANEOUS) IMPLANT
BAG SPEC THK2 15X12 ZIP CLS (MISCELLANEOUS)
BAG ZIPLOCK 12X15 (MISCELLANEOUS) IMPLANT
BLADE SAG 18X100X1.27 (BLADE) ×2 IMPLANT
CLSR STERI-STRIP ANTIMIC 1/2X4 (GAUZE/BANDAGES/DRESSINGS) ×2 IMPLANT
COVER PERINEAL POST (MISCELLANEOUS) ×2 IMPLANT
COVER SURGICAL LIGHT HANDLE (MISCELLANEOUS) ×2 IMPLANT
COVER WAND RF STERILE (DRAPES) IMPLANT
CUP ACETBLR 52 OD PINNACLE (Hips) ×2 IMPLANT
DECANTER SPIKE VIAL GLASS SM (MISCELLANEOUS) ×2 IMPLANT
DRAPE STERI IOBAN 125X83 (DRAPES) ×2 IMPLANT
DRAPE U-SHAPE 47X51 STRL (DRAPES) ×4 IMPLANT
DRSG ADAPTIC 3X8 NADH LF (GAUZE/BANDAGES/DRESSINGS) ×2 IMPLANT
DRSG MEPILEX BORDER 4X4 (GAUZE/BANDAGES/DRESSINGS) ×2 IMPLANT
DRSG MEPILEX BORDER 4X8 (GAUZE/BANDAGES/DRESSINGS) ×2 IMPLANT
DURAPREP 26ML APPLICATOR (WOUND CARE) ×2 IMPLANT
ELECT REM PT RETURN 15FT ADLT (MISCELLANEOUS) ×2 IMPLANT
EVACUATOR 1/8 PVC DRAIN (DRAIN) ×2 IMPLANT
GLOVE BIO SURGEON STRL SZ 6 (GLOVE) IMPLANT
GLOVE BIO SURGEON STRL SZ7 (GLOVE) IMPLANT
GLOVE BIO SURGEON STRL SZ8 (GLOVE) ×2 IMPLANT
GLOVE BIOGEL PI IND STRL 6.5 (GLOVE) IMPLANT
GLOVE BIOGEL PI IND STRL 7.0 (GLOVE) IMPLANT
GLOVE BIOGEL PI IND STRL 8 (GLOVE) ×1 IMPLANT
GLOVE BIOGEL PI INDICATOR 6.5 (GLOVE)
GLOVE BIOGEL PI INDICATOR 7.0 (GLOVE)
GLOVE BIOGEL PI INDICATOR 8 (GLOVE) ×1
GOWN STRL REUS W/TWL LRG LVL3 (GOWN DISPOSABLE) ×2 IMPLANT
GOWN STRL REUS W/TWL XL LVL3 (GOWN DISPOSABLE) IMPLANT
HEAD FEM STD 32X+5 STRL (Hips) ×2 IMPLANT
HOLDER FOLEY CATH W/STRAP (MISCELLANEOUS) ×2 IMPLANT
KIT TURNOVER KIT A (KITS) IMPLANT
LINER MARATHON NEUT +4X52X32 (Hips) ×2 IMPLANT
MANIFOLD NEPTUNE II (INSTRUMENTS) ×2 IMPLANT
PACK ANTERIOR HIP CUSTOM (KITS) ×2 IMPLANT
STEM FEMORAL SZ5 HIGH ACTIS (Stem) ×2 IMPLANT
STRIP CLOSURE SKIN 1/2X4 (GAUZE/BANDAGES/DRESSINGS) ×2 IMPLANT
SUT ETHIBOND NAB CT1 #1 30IN (SUTURE) ×2 IMPLANT
SUT MNCRL AB 4-0 PS2 18 (SUTURE) ×2 IMPLANT
SUT STRATAFIX 0 PDS 27 VIOLET (SUTURE) ×2
SUT VIC AB 2-0 CT1 27 (SUTURE) ×4
SUT VIC AB 2-0 CT1 TAPERPNT 27 (SUTURE) ×2 IMPLANT
SUTURE STRATFX 0 PDS 27 VIOLET (SUTURE) ×1 IMPLANT
SYR 50ML LL SCALE MARK (SYRINGE) IMPLANT
TRAY FOLEY MTR SLVR 16FR STAT (SET/KITS/TRAYS/PACK) ×2 IMPLANT
YANKAUER SUCT BULB TIP 10FT TU (MISCELLANEOUS) ×2 IMPLANT

## 2018-12-31 NOTE — Anesthesia Postprocedure Evaluation (Signed)
Anesthesia Post Note  Patient: Zachary Vaughn  Procedure(s) Performed: TOTAL HIP ARTHROPLASTY ANTERIOR APPROACH (Right Hip)     Patient location during evaluation: PACU Anesthesia Type: Spinal Level of consciousness: oriented and awake and alert Pain management: pain level controlled Vital Signs Assessment: post-procedure vital signs reviewed and stable Respiratory status: spontaneous breathing, respiratory function stable and nonlabored ventilation Cardiovascular status: blood pressure returned to baseline and stable Postop Assessment: no headache, no backache, no apparent nausea or vomiting and spinal receding Anesthetic complications: no    Last Vitals:  Vitals:   12/31/18 1115 12/31/18 1128  BP: 122/75 132/76  Pulse: 71 70  Resp: 19 16  Temp: 36.4 C 36.4 C  SpO2: 98% 97%    Last Pain:  Vitals:   12/31/18 1128  TempSrc:   PainSc: 0-No pain                 Lidia Collum

## 2018-12-31 NOTE — Discharge Instructions (Signed)
°Dr. Frank Aluisio °Total Joint Specialist °[facility] °[address] °[place], [place] [address] °[phone] ° °ANTERIOR APPROACH TOTAL HIP REPLACEMENT POSTOPERATIVE DIRECTIONS ° ° °Hip Rehabilitation, Guidelines Following Surgery  °The results of a hip operation are greatly improved after range of motion and muscle strengthening exercises. Follow all safety measures which are given to protect your hip. If any of these exercises cause increased pain or swelling in your joint, decrease the amount until you are comfortable again. Then slowly increase the exercises. Call your caregiver if you have problems or questions.  ° °HOME CARE INSTRUCTIONS  °• Remove items at home which could result in a fall. This includes throw rugs or furniture in walking pathways.  °· ICE to the affected hip every three hours for 30 minutes at a time and then as needed for pain and swelling.  Continue to use ice on the hip for pain and swelling from surgery. You may notice swelling that will progress down to the foot and ankle.  This is normal after surgery.  Elevate the leg when you are not up walking on it.   °· Continue to use the breathing machine which will help keep your temperature down.  It is common for your temperature to cycle up and down following surgery, especially at night when you are not up moving around and exerting yourself.  The breathing machine keeps your lungs expanded and your temperature down. ° °DIET °You may resume your previous home diet once your are discharged from the hospital. ° °DRESSING / WOUND CARE / SHOWERING °You may change your dressing 3-5 days after surgery.  Then change the dressing every day with sterile gauze.  Please use good hand washing techniques before changing the dressing.  Do not use any lotions or creams on the incision until instructed by your surgeon. °You may start showering once you are discharged home but do not submerge the incision under water. Just pat the  incision dry and apply a dry gauze dressing on daily. °Change the surgical dressing daily and reapply a dry dressing each time. ° °ACTIVITY °Walk with your walker as instructed. °Use walker as long as suggested by your caregivers. °Avoid periods of inactivity such as sitting longer than an hour when not asleep. This helps prevent blood clots.  °You may resume a sexual relationship in one month or when given the OK by your doctor.  °You may return to work once you are cleared by your doctor.  °Do not drive a car for 6 weeks or until released by you surgeon.  °Do not drive while taking narcotics. ° °WEIGHT BEARING °Weight bearing as tolerated with assist device (walker, cane, etc) as directed, use it as long as suggested by your surgeon or therapist, typically at least 4-6 weeks. ° °POSTOPERATIVE CONSTIPATION PROTOCOL °Constipation - defined medically as fewer than three stools per week and severe constipation as less than one stool per week. ° °One of the most common issues patients have following surgery is constipation.  Even if you have a regular bowel pattern at home, your normal regimen is likely to be disrupted due to multiple reasons following surgery.  Combination of anesthesia, postoperative narcotics, change in appetite and fluid intake all can affect your bowels.  In order to avoid complications following surgery, here are some recommendations in order to help you during your recovery period. ° °Colace (docusate) - Pick up an over-the-counter form of Colace or another stool softener and take twice a day   as long as you are requiring postoperative pain medications.  Take with a full glass of water daily.  If you experience loose stools or diarrhea, hold the colace until you stool forms back up.  If your symptoms do not get better within 1 week or if they get worse, check with your doctor. ° °Dulcolax (bisacodyl) - Pick up over-the-counter and take as directed by the product packaging as needed to assist with  the movement of your bowels.  Take with a full glass of water.  Use this product as needed if not relieved by Colace only.  ° °MiraLax (polyethylene glycol) - Pick up over-the-counter to have on hand.  MiraLax is a solution that will increase the amount of water in your bowels to assist with bowel movements.  Take as directed and can mix with a glass of water, juice, soda, coffee, or tea.  Take if you go more than two days without a movement. °Do not use MiraLax more than once per day. Call your doctor if you are still constipated or irregular after using this medication for 7 days in a row. ° °If you continue to have problems with postoperative constipation, please contact the office for further assistance and recommendations.  If you experience "the worst abdominal pain ever" or develop nausea or vomiting, please contact the office immediatly for further recommendations for treatment. ° °ITCHING ° If you experience itching with your medications, try taking only a single pain pill, or even half a pain pill at a time.  You can also use Benadryl over the counter for itching or also to help with sleep.  ° °TED HOSE STOCKINGS °Wear the elastic stockings on both legs for three weeks following surgery during the day but you may remove then at night for sleeping. ° °MEDICATIONS °See your medication summary on the “After Visit Summary” that the nursing staff will review with you prior to discharge.  You may have some home medications which will be placed on hold until you complete the course of blood thinner medication.  It is important for you to complete the blood thinner medication as prescribed by your surgeon.  Continue your approved medications as instructed at time of discharge. ° °PRECAUTIONS °If you experience chest pain or shortness of breath - call 911 immediately for transfer to the hospital emergency department.  °If you develop a fever greater that 101 F, purulent drainage from wound, increased redness or  drainage from wound, foul odor from the wound/dressing, or calf pain - CONTACT YOUR SURGEON.   °                                                °FOLLOW-UP APPOINTMENTS °Make sure you keep all of your appointments after your operation with your surgeon and caregivers. You should call the office at the above phone number and make an appointment for approximately two weeks after the date of your surgery or on the date instructed by your surgeon outlined in the "After Visit Summary". ° °RANGE OF MOTION AND STRENGTHENING EXERCISES  °These exercises are designed to help you keep full movement of your hip joint. Follow your caregiver's or physical therapist's instructions. Perform all exercises about fifteen times, three times per day or as directed. Exercise both hips, even if you have had only one joint replacement. These exercises can be done on   a training (exercise) mat, on the floor, on a table or on a bed. Use whatever works the best and is most comfortable for you. Use music or television while you are exercising so that the exercises are a pleasant break in your day. This will make your life better with the exercises acting as a break in routine you can look forward to.   Lying on your back, slowly slide your foot toward your buttocks, raising your knee up off the floor. Then slowly slide your foot back down until your leg is straight again.   Lying on your back spread your legs as far apart as you can without causing discomfort.   Lying on your side, raise your upper leg and foot straight up from the floor as far as is comfortable. Slowly lower the leg and repeat.   Lying on your back, tighten up the muscle in the front of your thigh (quadriceps muscles). You can do this by keeping your leg straight and trying to raise your heel off the floor. This helps strengthen the largest muscle supporting your knee.   Lying on your back, tighten up the muscles of your buttocks both with the legs straight and with  the knee bent at a comfortable angle while keeping your heel on the floor.   IF YOU ARE TRANSFERRED TO A SKILLED REHAB FACILITY If the patient is transferred to a skilled rehab facility following release from the hospital, a list of the current medications will be sent to the facility for the patient to continue.  When discharged from the skilled rehab facility, please have the facility set up the patient's [place] prior to being released. Also, the skilled facility will be responsible for providing the patient with their medications at time of release from the facility to include their pain medication, the muscle relaxants, and their blood thinner medication. If the patient is still at the rehab facility at time of the two week follow up appointment, the skilled rehab facility will also need to assist the patient in arranging follow up appointment in our office and any transportation needs.  MAKE SURE YOU:   Understand these instructions.   Get help right away if you are not doing well or get worse.    Pick up stool softner and laxative for home use following surgery while on pain medications. Do not submerge incision under water. Please use good hand washing techniques while changing dressing each day. May shower starting three days after surgery. Please use a clean towel to pat the incision dry following showers. Continue to use ice for pain and swelling after surgery. Do not use any lotions or creams on the incision until instructed by your surgeon.

## 2018-12-31 NOTE — Anesthesia Procedure Notes (Signed)
Spinal  Patient location during procedure: OR Start time: 12/31/2018 8:27 AM End time: 12/31/2018 8:31 AM Staffing Anesthesiologist: Lidia Collum, MD Resident/CRNA: Montel Clock, CRNA Performed: resident/CRNA  Preanesthetic Checklist Completed: patient identified, surgical consent, pre-op evaluation, timeout performed, IV checked, risks and benefits discussed and monitors and equipment checked Spinal Block Patient position: sitting Prep: DuraPrep Patient monitoring: heart rate, cardiac monitor, continuous pulse ox and blood pressure Approach: midline Location: L2-3 Injection technique: single-shot Needle Needle type: Pencan  Needle gauge: 24 G Needle length: 10 cm Needle insertion depth: 7 cm Assessment Sensory level: T6

## 2018-12-31 NOTE — Interval H&P Note (Cosign Needed)
History and Physical Interval Note:  12/31/2018 8:20 AM  Zachary Vaughn  has presented today for surgery, with the diagnosis of right hip osteoarthritis.  The various methods of treatment have been discussed with the patient and family. After consideration of risks, benefits and other options for treatment, the patient has consented to  Procedure(s) with comments: [place] (Right) - 132min as a surgical intervention.  The patient's history has been reviewed, patient examined, no change in status, stable for surgery.  I have reviewed the patient's chart and labs.  Questions were answered to the patient's satisfaction.     [name] Laiba Fuerte

## 2018-12-31 NOTE — Evaluation (Signed)
Physical Therapy Evaluation Patient Details Name: Zachary Vaughn MRN: [id number] DOB: Oct 18, 1940 Today's Date: 12/31/2018   History of Present Illness  s/p R DA THA  Clinical Impression  Pt is s/p THA resulting in the deficits listed below (see PT Problem List).  Pt  amb ~ 27' with RW and min/guard assist, anticipate pt will continue to progress well.  Pt will benefit from skilled PT to increase their independence and safety with mobility to allow discharge to the venue listed below.      Follow Up Recommendations Follow surgeon's recommendation for DC plan and follow-up therapies(HEP)    Equipment Recommendations  None recommended by PT    Recommendations for Other Services       Precautions / Restrictions Precautions Precautions: Fall Restrictions Weight Bearing Restrictions: No RLE Weight Bearing: Weight bearing as tolerated      Mobility  Bed Mobility Overal bed mobility: Needs Assistance Bed Mobility: Supine to Sit     Supine to sit: Min guard     General bed mobility comments: for safety  Transfers Overall transfer level: Needs assistance Equipment used: Rolling walker (2 wheeled) Transfers: Sit to/from Stand Sit to Stand: Min guard         General transfer comment: cues for hand placement  Ambulation/Gait Ambulation/Gait assistance: Min guard;Min assist Gait Distance (Feet): 80 Feet Assistive device: Rolling walker (2 wheeled) Gait Pattern/deviations: Step-to pattern;Decreased weight shift to right     General Gait Details: cues for RW safety, initial sequence  Stairs            Wheelchair Mobility    Modified Rankin (Stroke Patients Only)       Balance                                             Pertinent Vitals/Pain Pain Assessment: 0-10 Pain Score: 5  Pain Location: right hip Pain Descriptors / Indicators: Sore Pain Intervention(s): Limited activity within patient's tolerance;Monitored during  session;Repositioned;Premedicated before session    Home Living Family/patient expects to be discharged to:: Private residence Living Arrangements: Children Available Help at Discharge: Family Type of Home: House Home Access: Stairs to enter   Technical brewer of Steps: 3 or 1 Home Layout: One level;Two level;Bed/bath upstairs(recliner downstairs) Home Equipment: Walker - 2 wheels;Cane - single point      Prior Function Level of Independence: Independent               Hand Dominance        Extremity/Trunk Assessment   Upper Extremity Assessment Upper Extremity Assessment: Overall WFL for tasks assessed    Lower Extremity Assessment Lower Extremity Assessment: RLE deficits/detail RLE Deficits / Details: AAROM grossly WFL, knee and hip 2+/5 to 3/5, limited by post op pain and weakness       Communication   Communication: No difficulties  Cognition Arousal/Alertness: Awake/alert Behavior During Therapy: WFL for tasks assessed/performed Overall Cognitive Status: Within Functional Limits for tasks assessed                                        General Comments      Exercises Total Joint Exercises Ankle Circles/Pumps: AROM;10 reps;Both   Assessment/Plan    PT Assessment Patient needs continued PT services  PT Problem List  Decreased strength;Decreased range of motion;Decreased activity tolerance;Decreased balance;Decreased mobility;Pain;Decreased knowledge of use of DME       PT Treatment Interventions DME instruction;Therapeutic exercise;Gait training;Functional mobility training;Stair training;Therapeutic activities;Patient/family education    PT Goals (Current goals can be found in the Care Plan section)  Acute Rehab PT Goals PT Goal Formulation: With patient Time For Goal Achievement: 01/07/19 Potential to Achieve Goals: Good    Frequency 7X/week   Barriers to discharge        Co-evaluation               AM-PAC PT  "6 Clicks" Mobility  Outcome Measure Help needed turning from your back to your side while in a flat bed without using bedrails?: A Little Help needed moving from lying on your back to sitting on the side of a flat bed without using bedrails?: A Little Help needed moving to and from a bed to a chair (including a wheelchair)?: A Little Help needed standing up from a chair using your arms (e.g., wheelchair or bedside chair)?: A Little Help needed to walk in hospital room?: A Little Help needed climbing 3-5 steps with a railing? : A Little 6 Click Score: 18    End of Session Equipment Utilized During Treatment: Gait belt Activity Tolerance: Patient tolerated treatment well Patient left: with call bell/phone within reach;in chair;with chair alarm set Nurse Communication: Mobility status PT Visit Diagnosis: Difficulty in walking, not elsewhere classified (R26.2)    Time: 3151-7616 PT Time Calculation (min) (ACUTE ONLY): 24 min   Charges:   PT Evaluation $PT Eval Low Complexity: 1 Low PT Treatments $Gait Training: 8-22 mins        Kenyon Ana, PT  Pager: [phone] Acute Rehab Dept [facility]): [phone]   12/31/2018   [facility] [place] 12/31/2018, 4:00 PM

## 2018-12-31 NOTE — Op Note (Signed)
OPERATIVE REPORT- TOTAL HIP ARTHROPLASTY   PREOPERATIVE DIAGNOSIS: Osteoarthritis of the Right hip.   POSTOPERATIVE DIAGNOSIS: Osteoarthritis of the Right  hip.   PROCEDURE: Right total hip arthroplasty, anterior approach.   SURGEON: Gaynelle Arabian, MD   ASSISTANT: Theresa Duty, PA-C  ANESTHESIA:  Spinal  ESTIMATED BLOOD LOSS:-250 mL    DRAINS: Hemovac x1.   COMPLICATIONS: None   CONDITION: PACU - hemodynamically stable.   BRIEF CLINICAL NOTE: Zachary Vaughn is a 78 y.o. male who has advanced end-  stage arthritis of their Right  hip with progressively worsening pain and  dysfunction.The patient has failed nonoperative management and presents for  total hip arthroplasty.   PROCEDURE IN DETAIL: After successful administration of spinal  anesthetic, the traction boots for the [facility] bed were placed on both  feet and the patient was placed onto the [facility] bed, boots placed into the leg  holders. The Right hip was then isolated from the perineum with plastic  drapes and prepped and draped in the usual sterile fashion. ASIS and  greater trochanter were marked and a oblique incision was made, starting  at about 1 cm lateral and 2 cm distal to the ASIS and coursing towards  the anterior cortex of the femur. The skin was cut with a 10 blade  through subcutaneous tissue to the level of the fascia overlying the  tensor fascia lata muscle. The fascia was then incised in line with the  incision at the junction of the anterior third and posterior 2/3rd. The  muscle was teased off the fascia and then the interval between the TFL  and the rectus was developed. The Hohmann retractor was then placed at  the top of the femoral neck over the capsule. The vessels overlying the  capsule were cauterized and the fat on top of the capsule was removed.  A Hohmann retractor was then placed anterior underneath the rectus  femoris to give exposure to the entire anterior capsule. A T-shaped   capsulotomy was performed. The edges were tagged and the femoral head  was identified.       Osteophytes are removed off the superior acetabulum.  The femoral neck was then cut in situ with an oscillating saw. Traction  was then applied to the left lower extremity utilizing the [facility]  traction. The femoral head was then removed. Retractors were placed  around the acetabulum and then circumferential removal of the labrum was  performed. Osteophytes were also removed. Reaming starts at 49 mm to  medialize and  Increased in 2 mm increments to 51 mm. We reamed in  approximately 40 degrees of abduction, 20 degrees anteversion. A 52 mm  pinnacle acetabular shell was then impacted in anatomic position under  fluoroscopic guidance with excellent purchase. We did not need to place  any additional dome screws. A 32 mm n eutral + 4 marathon liner was then  placed into the acetabular shell.       The femoral lift was then placed along the lateral aspect of the femur  just distal to the vastus ridge. The leg was  externally rotated and capsule  was stripped off the inferior aspect of the femoral neck down to the  level of the lesser trochanter, this was done with electrocautery. The femur was lifted after this was performed. The  leg was then placed in an extended and adducted position essentially delivering the femur. We also removed the capsule superiorly and the piriformis from the  piriformis fossa to gain excellent exposure of the  proximal femur. Rongeur was used to remove some cancellous bone to get  into the lateral portion of the proximal femur for placement of the  initial starter reamer. The starter broaches was placed  the starter broach  and was shown to go down the center of the canal. Broaching  with the Actis system was then performed starting at size 0  coursing  Up to size 5. A size 5 had excellent torsional and rotational  and axial stability. The trial high offset neck was then placed   with a 32 + 5 trial head. The hip was then reduced. We confirmed that  the stem was in the canal both on AP and lateral x-rays. It also has excellent sizing. The hip was reduced with outstanding stability through full extension and full external rotation.. AP pelvis was taken and the leg lengths were measured and found to be equal. Hip was then dislocated again and the femoral head and neck removed. The  femoral broach was removed. Size 5 Actis stem with a high offset  neck was then impacted into the femur following native anteversion. Has  excellent purchase in the canal. Excellent torsional and rotational and  axial stability. It is confirmed to be in the canal on AP and lateral  fluoroscopic views. The 32 + 5 metal head was placed and the hip  reduced with outstanding stability. Again AP pelvis was taken and it  confirmed that the leg lengths were equal. The wound was then copiously  irrigated with saline solution and the capsule reattached and repaired  with Ethibond suture. 30 ml of .25% Bupivicaine was  injected into the capsule and into the edge of the tensor fascia lata as well as subcutaneous tissue. The fascia overlying the tensor fascia lata was then closed with a running #1 V-Loc. Subcu was closed with interrupted 2-0 Vicryl and subcuticular running 4-0 Monocryl. Incision was cleaned  and dried. Steri-Strips and a bulky sterile dressing applied. Hemovac  drain was hooked to suction and then the patient was awakened and transported to  recovery in stable condition.        Please note that a surgical assistant was a medical necessity for this procedure to perform it in a safe and expeditious manner. Assistant was necessary to provide appropriate retraction of vital neurovascular structures and to prevent femoral fracture and allow for anatomic placement of the prosthesis.  Gaynelle Arabian, M.D.

## 2018-12-31 NOTE — Transfer of Care (Signed)
Immediate Anesthesia Transfer of Care Note  Patient: Zachary Vaughn  Procedure(s) Performed: TOTAL HIP ARTHROPLASTY ANTERIOR APPROACH (Right Hip)  Patient Location: PACU  Anesthesia Type:Spinal  Level of Consciousness: drowsy and patient cooperative  Airway & Oxygen Therapy: Patient Spontanous Breathing and Patient connected to face mask oxygen  Post-op Assessment: Report given to RN and Post -op Vital signs reviewed and stable  Post vital signs: Reviewed and stable  Last Vitals:  Vitals Value Taken Time  BP    Temp    Pulse 73 12/31/18 1000  Resp 15 12/31/18 1000  SpO2 98 % 12/31/18 1000  Vitals shown include unvalidated device data.  Last Pain:  Vitals:   12/31/18 0637  TempSrc: Oral  PainSc: 3          Complications: No apparent anesthesia complications

## 2018-12-31 NOTE — Anesthesia Preprocedure Evaluation (Addendum)
Anesthesia Evaluation  Patient identified by MRN, date of birth, ID band Patient awake    Reviewed: Allergy & Precautions, NPO status , Patient's Chart, lab work & pertinent test results  History of Anesthesia Complications Negative for: history of anesthetic complications  Airway Mallampati: II  TM Distance: >3 FB Neck ROM: Full    Dental  (+) Teeth Intact   Pulmonary neg pulmonary ROS,    Pulmonary exam normal        Cardiovascular hypertension, Pt. on medications Normal cardiovascular exam     Neuro/Psych negative neurological ROS  negative psych ROS   GI/Hepatic Neg liver ROS, GERD  Medicated,  Endo/Other  diabetes, Type 2, Insulin Dependent, Oral Hypoglycemic Agents  Renal/GU   negative genitourinary   Musculoskeletal  (+) Arthritis ,   Abdominal   Peds  Hematology negative hematology ROS (+)   Anesthesia Other Findings Spinal stenosis s/p lami/microdiscectomy  Reproductive/Obstetrics                           Anesthesia Physical Anesthesia Plan  ASA: III  Anesthesia Plan: Spinal   Post-op Pain Management:    Induction:   PONV Risk Score and Plan: 1 and Propofol infusion and Treatment may vary due to age or medical condition  Airway Management Planned: Natural Airway, Nasal Cannula and Simple Face Mask  Additional Equipment: None  Intra-op Plan:   Post-operative Plan:   Informed Consent: I have reviewed the patients History and Physical, chart, labs and discussed the procedure including the risks, benefits and alternatives for the proposed anesthesia with the patient or authorized representative who has indicated his/her understanding and acceptance.       Plan Discussed with:   Anesthesia Plan Comments:        Anesthesia Quick Evaluation

## 2018-12-31 NOTE — Plan of Care (Signed)

## 2019-01-01 ENCOUNTER — Encounter (HOSPITAL_COMMUNITY): Payer: Self-pay | Admitting: Orthopedic Surgery

## 2019-01-01 LAB — CBC
HCT: 39.1 % (ref 39.0–52.0)
Hemoglobin: 12.4 g/dL — ABNORMAL LOW (ref 13.0–17.0)
MCH: 28.6 pg (ref 26.0–34.0)
MCHC: 31.7 g/dL (ref 30.0–36.0)
MCV: 90.1 fL (ref 80.0–100.0)
Platelets: 157 10*3/uL (ref 150–400)
RBC: 4.34 MIL/uL (ref 4.22–5.81)
RDW: 14.4 % (ref 11.5–15.5)
WBC: 15.5 10*3/uL — ABNORMAL HIGH (ref 4.0–10.5)
nRBC: 0 % (ref 0.0–0.2)

## 2019-01-01 LAB — BASIC METABOLIC PANEL
Anion gap: 10 (ref 5–15)
BUN: 13 mg/dL (ref 8–23)
CO2: 22 mmol/L (ref 22–32)
Calcium: 8.4 mg/dL — ABNORMAL LOW (ref 8.9–10.3)
Chloride: 105 mmol/L (ref 98–111)
Creatinine, Ser: 0.82 mg/dL (ref 0.61–1.24)
GFR calc Af Amer: >60 mL/min (ref >60)
GFR calc non Af Amer: >60 mL/min (ref >60)
Glucose, Bld: 141 mg/dL — ABNORMAL HIGH (ref 70–99)
Potassium: 3.8 mmol/L (ref 3.5–5.1)
Sodium: 137 mmol/L (ref 135–145)

## 2019-01-01 LAB — GLUCOSE, CAPILLARY
Glucose-Capillary: 256 mg/dL — ABNORMAL HIGH (ref 70–99)
Glucose-Capillary: 172 mg/dL — ABNORMAL HIGH (ref 70–99)
Glucose-Capillary: 179 mg/dL — ABNORMAL HIGH (ref 70–99)

## 2019-01-01 MED ORDER — HYDROCODONE-ACETAMINOPHEN 5-325 MG PO TABS: 1.0000 | ORAL_TABLET | Freq: Four times a day (QID) | ORAL | 0 refills | Status: DC | PRN

## 2019-01-01 MED ORDER — TRAMADOL HCL 50 MG PO TABS: 50.0000 mg | ORAL_TABLET | Freq: Four times a day (QID) | ORAL | 0 refills | Status: DC | PRN

## 2019-01-01 MED ORDER — METHOCARBAMOL 500 MG PO TABS: 500.0000 mg | ORAL_TABLET | Freq: Four times a day (QID) | ORAL | 1 refills | Status: DC | PRN

## 2019-01-01 MED ORDER — ASPIRIN 325 MG PO TBEC: 325.0000 mg | DELAYED_RELEASE_TABLET | Freq: Two times a day (BID) | ORAL | 0 refills | Status: DC

## 2019-01-01 NOTE — Progress Notes (Signed)
   Subjective: 1 Day Post-Op Procedure(s) (LRB): TOTAL HIP ARTHROPLASTY ANTERIOR APPROACH (Right) Patient reports pain as mild.   Plan is to go Home after hospital stay.  Objective: Vital signs in last 24 hours: Temp:  [97.6 F (36.4 C)-98.8 F (37.1 C)] 98.2 F (36.8 C) (07/02 0535) Pulse Rate:  [66-82] 77 (07/02 0535) Resp:  [14-21] 16 (07/02 0535) BP: (95-152)/(64-84) 138/78 (07/02 0535) SpO2:  [95 %-100 %] 99 % (07/02 0535)  Intake/Output from previous day:  Intake/Output Summary (Last 24 hours) at 01/01/2019 0706 Last data filed at 01/01/2019 0600 Gross per 24 hour  Intake 4557.72 ml  Output 2820 ml  Net 1737.72 ml    Intake/Output this shift: No intake/output data recorded.  Labs: Recent Labs    01/01/19 0256  HGB 12.4*   Recent Labs    01/01/19 0256  WBC 15.5*  RBC 4.34  HCT 39.1  PLT 157   Recent Labs    01/01/19 0256  NA 137  K 3.8  CL 105  CO2 22  BUN 13  CREATININE 0.82  GLUCOSE 141*  CALCIUM 8.4*   No results for input(s): LABPT, INR in the last 72 hours.  EXAM General - Patient is Alert, Appropriate and Oriented Extremity - Neurologically intact Neurovascular intact No cellulitis present Compartment soft Dressing - dressing C/D/I Motor Function - intact, moving foot and toes well on exam.    Past Medical History:  Diagnosis Date  . Actinic keratosis   . Allergic rhinitis    seasonal   . 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 Complication of anesthesia    per Pt after back surgery 05/ 2019 couldn't sleep x 7 days, hypervigilant  . DDD (degenerative disc disease), lumbar   . Diverticulosis   . Erectile dysfunction   . GERD (gastroesophageal reflux disease)   . History of bladder stone   . History of bladder stone   . History of kidney stones   . Hyperlipidemia   . Hypertension   . OA (osteoarthritis)    hips  . Osteoporosis   . Spinal stenosis   .  Spondylosis   . Type 2 diabetes mellitus treated with insulin ([place])    followed by pcp  . Wears glasses     Assessment/Plan: 1 Day Post-Op Procedure(s) (LRB): TOTAL HIP ARTHROPLASTY ANTERIOR APPROACH (Right) Principal Problem:   OA (osteoarthritis) of hip   Advance diet Up with therapy  Discharge home this afternoon after PT. Will do HEP   Weight Bearing As Tolerated right Leg   Zachary Vaughn

## 2019-01-01 NOTE — Progress Notes (Signed)
   01/01/19 1300  PT Visit Information  Last PT Received On 01/01/19--pt  Progressing well; assisted pt with dressing (min assist UB/LB); reviewed HEP pt able to return demo. Should continue to progress well; ready for d/c from PT standpoint  Assistance Needed +1  History of Present Illness s/p R DA THA  Precautions  Precautions Fall  Restrictions  Weight Bearing Restrictions No  RLE Weight Bearing WBAT  Pain Assessment  Pain Assessment 0-10  Pain Score 5  Pain Location right hip  Pain Descriptors / Indicators Sore  Pain Intervention(s) Limited activity within patient's tolerance;Monitored during session  Cognition  Arousal/Alertness Awake/alert  Behavior During Therapy WFL for tasks assessed/performed  Overall Cognitive Status Within Functional Limits for tasks assessed  Bed Mobility  General bed mobility comments in chair on PT arrival  Transfers  Overall transfer level Needs assistance  Equipment used Rolling walker (2 wheeled)  Transfers Sit to/from Stand  Sit to Stand Modified independent (Device/Increase time)  General transfer comment cues for hand placement  Ambulation/Gait  Ambulation/Gait assistance Supervision  Gait Distance (Feet) 15 Feet (x2)  Assistive device Rolling walker (2 wheeled)  Gait Pattern/deviations Step-to pattern;Step-through pattern;Decreased weight shift to right  General Gait Details cues for RW safety  Total Joint Exercises  Ankle Circles/Pumps AROM;10 reps;Both  Quad Sets AROM;Both;10 reps  Heel Slides AAROM;Right;10 reps  Hip ABduction/ADduction AROM;Right;10 reps;Supine;Standing  Long Arc Quad AROM;Right;10 reps  Knee Flexion AROM;Right;10 reps;Standing  Marching in Standing AROM;Right;10 reps;Standing  Standing Hip Extension AROM;Right;Standing;10 reps  PT - End of Session  Equipment Utilized During Treatment Gait belt  Activity Tolerance Patient tolerated treatment well  Patient left with call bell/phone within reach;in chair;with  chair alarm set  Nurse Communication Mobility status   PT - Assessment/Plan  PT Plan Current plan remains appropriate  PT Visit Diagnosis Difficulty in walking, not elsewhere classified (R26.2)  PT Frequency (ACUTE ONLY) 7X/week  Follow Up Recommendations Follow surgeon's recommendation for DC plan and follow-up therapies  PT equipment None recommended by PT  AM-PAC PT "6 Clicks" Mobility Outcome Measure (Version 2)  Help needed turning from your back to your side while in a flat bed without using bedrails? 3  Help needed moving from lying on your back to sitting on the side of a flat bed without using bedrails? 3  Help needed moving to and from a bed to a chair (including a wheelchair)? 3  Help needed standing up from a chair using your arms (e.g., wheelchair or bedside chair)? 3  Help needed to walk in hospital room? 3  Help needed climbing 3-5 steps with a railing?  3  6 Click Score 18  Consider Recommendation of Discharge To: Home with [facility]  PT Goal Progression  Progress towards PT goals Progressing toward goals  Acute Rehab PT Goals  PT Goal Formulation With patient  Time For Goal Achievement 01/07/19  Potential to Achieve Goals Good  PT Time Calculation  PT Start Time (ACUTE ONLY) 1245  PT Stop Time (ACUTE ONLY) 1306  PT Time Calculation (min) (ACUTE ONLY) 21 min  PT General Charges  $$ ACUTE PT VISIT 1 Visit  PT Treatments  $Therapeutic Exercise 8-22 mins

## 2019-01-01 NOTE — Progress Notes (Signed)
Physical Therapy Treatment Patient Details Name: Zachary Vaughn MRN: [id number] DOB: 1941/02/26 Today's Date: 01/01/2019    History of Present Illness s/p R DA THA    PT Comments    Pt progressing well, will see for a second session to instruct in HEP  Follow Up Recommendations  Follow surgeon's recommendation for DC plan and follow-up therapies     Equipment Recommendations  None recommended by PT    Recommendations for Other Services       Precautions / Restrictions Precautions Precautions: Fall Restrictions Weight Bearing Restrictions: No RLE Weight Bearing: Weight bearing as tolerated    Mobility  Bed Mobility               General bed mobility comments: in chair on PT arrival  Transfers Overall transfer level: Needs assistance Equipment used: Rolling walker (2 wheeled) Transfers: Sit to/from Stand Sit to Stand: Supervision         General transfer comment: cues for hand placement  Ambulation/Gait Ambulation/Gait assistance: Min guard;Supervision Gait Distance (Feet): 300 Feet Assistive device: Rolling walker (2 wheeled) Gait Pattern/deviations: Step-to pattern;Step-through pattern;Decreased weight shift to right     General Gait Details: cues for RW safety, initial sequence   Stairs             Wheelchair Mobility    Modified Rankin (Stroke Patients Only)       Balance                                            Cognition Arousal/Alertness: Awake/alert Behavior During Therapy: WFL for tasks assessed/performed Overall Cognitive Status: Within Functional Limits for tasks assessed                                        Exercises      General Comments        Pertinent Vitals/Pain Pain Assessment: 0-10 Pain Score: 4  Pain Location: right hip Pain Descriptors / Indicators: Sore Pain Intervention(s): Limited activity within patient's tolerance;Monitored during session;Premedicated before  session;Repositioned    Home Living                      Prior Function            PT Goals (current goals can now be found in the care plan section) Acute Rehab PT Goals PT Goal Formulation: With patient Time For Goal Achievement: 01/07/19 Potential to Achieve Goals: Good Progress towards PT goals: Progressing toward goals    Frequency    7X/week      PT Plan Current plan remains appropriate    Co-evaluation              AM-PAC PT "6 Clicks" Mobility   Outcome Measure  Help needed turning from your back to your side while in a flat bed without using bedrails?: A Little Help needed moving from lying on your back to sitting on the side of a flat bed without using bedrails?: A Little Help needed moving to and from a bed to a chair (including a wheelchair)?: A Little Help needed standing up from a chair using your arms (e.g., wheelchair or bedside chair)?: A Little Help needed to walk in hospital room?: A Little Help needed climbing 3-5 steps with  a railing? : A Little 6 Click Score: 18    End of Session Equipment Utilized During Treatment: Gait belt Activity Tolerance: Patient tolerated treatment well Patient left: with call bell/phone within reach;in chair;with chair alarm set Nurse Communication: Mobility status PT Visit Diagnosis: Difficulty in walking, not elsewhere classified (R26.2)     Time: 1023-1050 PT Time Calculation (min) (ACUTE ONLY): 27 min  Charges:  $Gait Training: 23-37 mins                     Kenyon Ana, PT  Pager: [phone] Acute Rehab Dept [facility]): [phone]   01/01/2019    [facility] 01/01/2019, 12:00 PM

## 2019-01-02 NOTE — Progress Notes (Signed)
RN reviewed discharge instructions with patient. All questions answered.   Paperwork and prescriptions given to patient.   NT rolled patient down with all belongings to family car.

## 2019-01-04 NOTE — Discharge Summary (Signed)
Physician Discharge Summary   Patient ID: Zachary Vaughn MRN: [id number] DOB/AGE: 02-10-1941 78 y.o.  Admit date: 12/31/2018 Discharge date: 01/01/2019  Primary Diagnosis: Primary osteoarthritis right hip  Admission Diagnoses:  Past Medical History:  Diagnosis Date   Actinic keratosis    Allergic rhinitis    seasonal    Benign localized prostatic hyperplasia with lower urinary tract symptoms (LUTS)    urologist-- dr Junious Silk   Complication of anesthesia    per Pt after back surgery 05/ 2019 couldn't sleep x 7 days, hypervigilant   DDD (degenerative disc disease), lumbar    Diverticulosis    Erectile dysfunction    GERD (gastroesophageal reflux disease)    History of bladder stone    History of bladder stone    History of kidney stones    Hyperlipidemia    Hypertension    OA (osteoarthritis)    hips   Osteoporosis    Spinal stenosis    Spondylosis    Type 2 diabetes mellitus treated with insulin ([place])    followed by pcp   Wears glasses    Discharge Diagnoses:   Principal Problem:   OA (osteoarthritis) of hip  Estimated body mass index is 26.95 kg/m as calculated from the following:   Height as of this encounter: 5\' 9"  (1.753 m).   Weight as of this encounter: 82.8 kg.  Procedure(s) (LRB): TOTAL HIP ARTHROPLASTY ANTERIOR APPROACH (Right)   Consults: None  HPI: Zachary Vaughn, 78 y.o. male, has a history of pain and functional disability in the right hip(s) due to arthritis and patient has failed non-surgical conservative treatments for greater than 12 weeks to include corticosteriod injections and activity modification.  Onset of symptoms was gradual starting 7 years ago with gradually worsening course since that time.The patient noted no past surgery on the right hip(s).  Patient currently rates pain in the right hip at 10 out of 10 with activity. Patient has night pain, worsening of pain with activity and weight bearing and pain that interfers with  activities of daily living. Patient has evidence of severe bone-on-bone arthritis with large osteophytes and cysts by imaging studies. This condition presents safety issues increasing the risk of falls.There is no current active infection.  Laboratory Data: Admission on 12/31/2018, Discharged on 01/01/2019  Component Date Value Ref Range Status   Glucose-Capillary 12/31/2018 112* 70 - 99 mg/dL Final   Glucose-Capillary 12/31/2018 72  70 - 99 mg/dL Final   WBC 01/01/2019 15.5* 4.0 - 10.5 K/uL Final   RBC 01/01/2019 4.34  4.22 - 5.81 MIL/uL Final   Hemoglobin 01/01/2019 12.4* 13.0 - 17.0 g/dL Final   HCT 01/01/2019 39.1  39.0 - 52.0 % Final   MCV 01/01/2019 90.1  80.0 - 100.0 fL Final   MCH 01/01/2019 28.6  26.0 - 34.0 pg Final   MCHC 01/01/2019 31.7  30.0 - 36.0 g/dL Final   RDW 01/01/2019 14.4  11.5 - 15.5 % Final   Platelets 01/01/2019 157  150 - 400 K/uL Final   nRBC 01/01/2019 0.0  0.0 - 0.2 % Final   Performed at [facility], [place] [address], [place], [place] [address]   Sodium 01/01/2019 137  135 - 145 mmol/L Final   Potassium 01/01/2019 3.8  3.5 - 5.1 mmol/L Final   Chloride 01/01/2019 105  98 - 111 mmol/L Final   CO2 01/01/2019 22  22 - 32 mmol/L Final   Glucose, Bld 01/01/2019 141* 70 - 99 mg/dL Final  BUN 01/01/2019 13  8 - 23 mg/dL Final   Creatinine, Ser 01/01/2019 0.82  0.61 - 1.24 mg/dL Final   Calcium 01/01/2019 8.4* 8.9 - 10.3 mg/dL Final   GFR calc non Af Amer 01/01/2019 >60  >60 mL/min Final   GFR calc Af Amer 01/01/2019 >60  >60 mL/min Final   Anion gap 01/01/2019 10  5 - 15 Final   Performed at [facility], [place] [address] [place], [place] [address]   Glucose-Capillary 12/31/2018 239* 70 - 99 mg/dL Final   Glucose-Capillary 01/01/2019 179* 70 - 99 mg/dL Final   Glucose-Capillary 12/31/2018 256* 70 - 99 mg/dL Final   Glucose-Capillary 01/01/2019 172* 70 - 99 mg/dL Final  Hospital Outpatient Visit  on 12/27/2018  Component Date Value Ref Range Status   SARS Coronavirus 2 12/27/2018 NEGATIVE  NEGATIVE Final   Comment: (NOTE) SARS-CoV-2 target nucleic acids are NOT DETECTED. The SARS-CoV-2 RNA is generally detectable in upper and lower respiratory specimens during the acute phase of infection. Negative results do not preclude SARS-CoV-2 infection, do not rule out co-infections with other pathogens, and should not be used as the sole basis for treatment or other patient management decisions. Negative results must be combined with clinical observations, patient history, and epidemiological information. The expected result is Negative. Fact Sheet for Patients: SugarRoll.be Fact Sheet for Healthcare Providers: https://www.woods-mathews.com/ This test is not yet approved or cleared by the [place] FDA and  has been authorized for detection and/or diagnosis of SARS-CoV-2 by FDA under an Emergency Use Authorization (EUA). This EUA will remain  in effect (meaning this test can be used) for the duration of the COVID-19 declaration under Section 56                          4(b)(1) of the Act, 21 U.S.C. section 360bbb-3(b)(1), unless the authorization is terminated or revoked sooner. Performed at [facility], [place] [address] [place], [place] [address]   Hospital Outpatient Visit on 12/24/2018  Component Date Value Ref Range Status   Glucose-Capillary 12/24/2018 164* 70 - 99 mg/dL Final   aPTT 12/24/2018 29  24 - 36 seconds Final   Performed at [facility], [place] [address] [place], [place] [address]   WBC 12/24/2018 7.3  4.0 - 10.5 K/uL Final   RBC 12/24/2018 4.69  4.22 - 5.81 MIL/uL Final   Hemoglobin 12/24/2018 13.5  13.0 - 17.0 g/dL Final   HCT 12/24/2018 42.7  39.0 - 52.0 % Final   MCV 12/24/2018 91.0  80.0 - 100.0 fL Final   MCH 12/24/2018 28.8  26.0 - 34.0 pg Final   MCHC 12/24/2018 31.6  30.0 -  36.0 g/dL Final   RDW 12/24/2018 15.3  11.5 - 15.5 % Final   Platelets 12/24/2018 181  150 - 400 K/uL Final   nRBC 12/24/2018 0.0  0.0 - 0.2 % Final   Performed at [facility], [place] [address], [place], [place] [address]   Sodium 12/24/2018 141  135 - 145 mmol/L Final   Potassium 12/24/2018 4.0  3.5 - 5.1 mmol/L Final   Chloride 12/24/2018 109  98 - 111 mmol/L Final   CO2 12/24/2018 24  22 - 32 mmol/L Final   Glucose, Bld 12/24/2018 130* 70 - 99 mg/dL Final   BUN 12/24/2018 20  8 - 23 mg/dL Final   Creatinine, Ser 12/24/2018 0.92  0.61 - 1.24 mg/dL Final  Calcium 12/24/2018 9.1  8.9 - 10.3 mg/dL Final   Total Protein 12/24/2018 6.6  6.5 - 8.1 g/dL Final   Albumin 12/24/2018 4.1  3.5 - 5.0 g/dL Final   AST 12/24/2018 18  15 - 41 U/L Final   ALT 12/24/2018 15  0 - 44 U/L Final   Alkaline Phosphatase 12/24/2018 82  38 - 126 U/L Final   Total Bilirubin 12/24/2018 1.0  0.3 - 1.2 mg/dL Final   GFR calc non Af Amer 12/24/2018 >60  >60 mL/min Final   GFR calc Af Amer 12/24/2018 >60  >60 mL/min Final   Anion gap 12/24/2018 8  5 - 15 Final   Performed at [facility], [place] [address] [place], [place] [address]   Prothrombin Time 12/24/2018 14.2  11.4 - 15.2 seconds Final   INR 12/24/2018 1.1  0.8 - 1.2 Final   Comment: (NOTE) INR goal varies based on device and disease states. Performed at [facility], [place] [address] [place], [place] [address]    ABO/RH(D) 12/24/2018 O POS   Final   Antibody Screen 12/24/2018 NEG   Final   Sample Expiration 12/24/2018 01/03/2019,2359   Final   Extend sample reason 12/24/2018    Final                   Value:NO TRANSFUSIONS OR PREGNANCY IN THE PAST 3 MONTHS Performed at [place] [address] [place], [place] [address]    MRSA, PCR 12/24/2018 NEGATIVE  NEGATIVE Final   Staphylococcus aureus 12/24/2018 NEGATIVE  NEGATIVE Final   Comment:  (NOTE) The Xpert SA Assay (FDA approved for NASAL specimens in patients 28 years of age and older), is one component of a comprehensive surveillance program. It is not intended to diagnose infection nor to guide or monitor treatment. Performed at [facility], [place] [address], [place], [place] [address]      X-Rays:Dg Pelvis Portable  Result Date: 12/31/2018 CLINICAL DATA:  Right hip replacement EXAM: PORTABLE PELVIS 1-2 VIEWS COMPARISON:  12/31/2018 FINDINGS: Right hip replacement in satisfactory position alignment. No fracture or complication. Lumbar laminectomy. IMPRESSION: Satisfactory right hip replacement. Electronically Signed   By: Franchot Gallo M.D.   On: 12/31/2018 11:10   Dg C-arm 1-60 Min-no Report  Result Date: 12/31/2018 Fluoroscopy was utilized by the requesting physician.  No radiographic interpretation.   Dg Hip Operative Unilat W Or W/o Pelvis Right  Result Date: 12/31/2018 CLINICAL DATA:  RIGHT hip replacement anterior approach EXAM: OPERATIVE RIGHT HIP (WITH PELVIS IF PERFORMED) 2 VIEWS TECHNIQUE: Fluoroscopic spot image(s) were submitted for interpretation post-operatively. COMPARISON:  None Fluoroscopy time: 1 0 minutes 11 seconds Images: 2 Dose: 0.99 mGy FINDINGS: Components of a RIGHT hip prosthesis are identified in expected position. No fracture, dislocation, or bone destruction. IMPRESSION: RIGHT hip prosthesis without acute complication. Electronically Signed   By: Lavonia Dana M.D.   On: 12/31/2018 09:53    EKG: Orders placed or performed during the hospital encounter of 12/24/18   EKG   EKG     Hospital Course: Patient was admitted to [facility] and taken to the OR and underwent the above state procedure without complications.  Patient tolerated the procedure well and was later transferred to the recovery room and then to the orthopaedic floor for postoperative care.  They were given PO and IV analgesics for pain control following  their surgery.  They were given 24 hours of postoperative antibiotics  of  Anti-infectives (From admission, onward)   Start     Dose/Rate Route Frequency Ordered Stop   12/31/18 1430  ceFAZolin (ANCEF) IVPB 2g/100 mL premix     2 g 200 mL/hr over 30 Minutes Intravenous Every 6 hours 12/31/18 1140 12/31/18 2039   12/31/18 0630  ceFAZolin (ANCEF) IVPB 2g/100 mL premix     2 g 200 mL/hr over 30 Minutes Intravenous On call to O.R. 12/31/18 7824 12/31/18 0837     and started on DVT prophylaxis in the form of Aspirin.   PT and OT were ordered for total hip protocol.  The patient was allowed to be WBAT with therapy. Discharge planning was consulted to help with postop disposition and equipment needs.  Patient had a good night on the evening of surgery.  They started to get up OOB with therapy on day one.  Hemovac drain was pulled without difficulty.  The patient had progressed with therapy and meeting their goals.  Incision was healing well.  Patient was seen in rounds and was ready to go home.  Diet: Cardiac diet Activity:WBAT Follow-up:in 2 weeks Disposition - Home Discharged Condition: stable   Discharge Instructions    Call MD / Call 911   Complete by: As directed    If you experience chest pain or shortness of breath, CALL 911 and be transported to the hospital emergency room.  If you develope a fever above 101 F, pus (white drainage) or increased drainage or redness at the wound, or calf pain, call your surgeon's office.   Call MD / Call 911   Complete by: As directed    If you experience chest pain or shortness of breath, CALL 911 and be transported to the hospital emergency room.  If you develope a fever above 101 F, pus (white drainage) or increased drainage or redness at the wound, or calf pain, call your surgeon's office.   Change dressing   Complete by: As directed    You may change your dressing on Friday, then change the dressing daily with sterile 4 x 4 inch gauze dressing and  paper tape.   Constipation Prevention   Complete by: As directed    Drink plenty of fluids.  Prune juice may be helpful.  You may use a stool softener, such as Colace (over the counter) 100 mg twice a day.  Use MiraLax (over the counter) for constipation as needed.   Constipation Prevention   Complete by: As directed    Drink plenty of fluids.  Prune juice may be helpful.  You may use a stool softener, such as Colace (over the counter) 100 mg twice a day.  Use MiraLax (over the counter) for constipation as needed.   Diet - low sodium heart healthy   Complete by: As directed    Diet - low sodium heart healthy   Complete by: As directed    Discharge instructions   Complete by: As directed    Dr. Gaynelle Arabian Total Joint Specialist [facility] [address], [place], [place] [address] [phone]  ANTERIOR APPROACH TOTAL HIP REPLACEMENT POSTOPERATIVE DIRECTIONS   Hip Rehabilitation, Guidelines Following Surgery  The results of a hip operation are greatly improved after range of motion and muscle strengthening exercises. Follow all safety measures which are given to protect your hip. If any of these exercises cause increased pain or swelling in your joint, decrease the amount until you are comfortable again. Then slowly increase the exercises. Call your caregiver  if you have problems or questions.   HOME CARE INSTRUCTIONS  Remove items at home which could result in a fall. This includes throw rugs or furniture in walking pathways.  ICE to the affected hip every three hours for 30 minutes at a time and then as needed for pain and swelling.  Continue to use ice on the hip for pain and swelling from surgery. You may notice swelling that will progress down to the foot and ankle.  This is normal after surgery.  Elevate the leg when you are not up walking on it.   Continue to use the breathing machine which will help keep your temperature down.  It is common for your temperature to  cycle up and down following surgery, especially at night when you are not up moving around and exerting yourself.  The breathing machine keeps your lungs expanded and your temperature down.  DIET You may resume your previous home diet once your are discharged from the hospital.  DRESSING / WOUND CARE / SHOWERING You may change your dressing 3-5 days after surgery.  Then change the dressing every day with sterile gauze.  Please use good hand washing techniques before changing the dressing.  Do not use any lotions or creams on the incision until instructed by your surgeon. You may start showering once you are discharged home but do not submerge the incision under water. Just pat the incision dry and apply a dry gauze dressing on daily. Change the surgical dressing daily and reapply a dry dressing each time.  ACTIVITY Walk with your walker as instructed. Use walker as long as suggested by your caregivers. Avoid periods of inactivity such as sitting longer than an hour when not asleep. This helps prevent blood clots.  You may resume a sexual relationship in one month or when given the OK by your doctor.  You may return to work once you are cleared by your doctor.  Do not drive a car for 6 weeks or until released by you surgeon.  Do not drive while taking narcotics.  WEIGHT BEARING Weight bearing as tolerated with assist device (walker, cane, etc) as directed, use it as long as suggested by your surgeon or therapist, typically at least 4-6 weeks.  POSTOPERATIVE CONSTIPATION PROTOCOL Constipation - defined medically as fewer than three stools per week and severe constipation as less than one stool per week.  One of the most common issues patients have following surgery is constipation.  Even if you have a regular bowel pattern at home, your normal regimen is likely to be disrupted due to multiple reasons following surgery.  Combination of anesthesia, postoperative narcotics, change in appetite and  fluid intake all can affect your bowels.  In order to avoid complications following surgery, here are some recommendations in order to help you during your recovery period.  Colace (docusate) - Pick up an over-the-counter form of Colace or another stool softener and take twice a day as long as you are requiring postoperative pain medications.  Take with a full glass of water daily.  If you experience loose stools or diarrhea, hold the colace until you stool forms back up.  If your symptoms do not get better within 1 week or if they get worse, check with your doctor.  Dulcolax (bisacodyl) - Pick up over-the-counter and take as directed by the product packaging as needed to assist with the movement of your bowels.  Take with a full glass of water.  Use this product as  needed if not relieved by Colace only.   MiraLax (polyethylene glycol) - Pick up over-the-counter to have on hand.  MiraLax is a solution that will increase the amount of water in your bowels to assist with bowel movements.  Take as directed and can mix with a glass of water, juice, soda, coffee, or tea.  Take if you go more than two days without a movement. Do not use MiraLax more than once per day. Call your doctor if you are still constipated or irregular after using this medication for 7 days in a row.  If you continue to have problems with postoperative constipation, please contact the office for further assistance and recommendations.  If you experience "the worst abdominal pain ever" or develop nausea or vomiting, please contact the office immediatly for further recommendations for treatment.  ITCHING  If you experience itching with your medications, try taking only a single pain pill, or even half a pain pill at a time.  You can also use Benadryl over the counter for itching or also to help with sleep.   TED HOSE STOCKINGS Wear the elastic stockings on both legs for three weeks following surgery during the day but you may remove  then at night for sleeping.  MEDICATIONS See your medication summary on the "After Visit Summary" that the nursing staff will review with you prior to discharge.  You may have some home medications which will be placed on hold until you complete the course of blood thinner medication.  It is important for you to complete the blood thinner medication as prescribed by your surgeon.  Continue your approved medications as instructed at time of discharge.  PRECAUTIONS If you experience chest pain or shortness of breath - call 911 immediately for transfer to the hospital emergency department.  If you develop a fever greater that 101 F, purulent drainage from wound, increased redness or drainage from wound, foul odor from the wound/dressing, or calf pain - CONTACT YOUR SURGEON.                                                   FOLLOW-UP APPOINTMENTS Make sure you keep all of your appointments after your operation with your surgeon and caregivers. You should call the office at the above phone number and make an appointment for approximately two weeks after the date of your surgery or on the date instructed by your surgeon outlined in the "After Visit Summary".  RANGE OF MOTION AND STRENGTHENING EXERCISES  These exercises are designed to help you keep full movement of your hip joint. Follow your caregiver's or physical therapist's instructions. Perform all exercises about fifteen times, three times per day or as directed. Exercise both hips, even if you have had only one joint replacement. These exercises can be done on a training (exercise) mat, on the floor, on a table or on a bed. Use whatever works the best and is most comfortable for you. Use music or television while you are exercising so that the exercises are a pleasant break in your day. This will make your life better with the exercises acting as a break in routine you can look forward to.  Lying on your back, slowly slide your foot toward your  buttocks, raising your knee up off the floor. Then slowly slide your foot back down until your leg  is straight again.  Lying on your back spread your legs as far apart as you can without causing discomfort.  Lying on your side, raise your upper leg and foot straight up from the floor as far as is comfortable. Slowly lower the leg and repeat.  Lying on your back, tighten up the muscle in the front of your thigh (quadriceps muscles). You can do this by keeping your leg straight and trying to raise your heel off the floor. This helps strengthen the largest muscle supporting your knee.  Lying on your back, tighten up the muscles of your buttocks both with the legs straight and with the knee bent at a comfortable angle while keeping your heel on the floor.   IF YOU ARE TRANSFERRED TO A SKILLED REHAB FACILITY If the patient is transferred to a skilled rehab facility following release from the hospital, a list of the current medications will be sent to the facility for the patient to continue.  When discharged from the skilled rehab facility, please have the facility set up the patient's [place] prior to being released. Also, the skilled facility will be responsible for providing the patient with their medications at time of release from the facility to include their pain medication, the muscle relaxants, and their blood thinner medication. If the patient is still at the rehab facility at time of the two week follow up appointment, the skilled rehab facility will also need to assist the patient in arranging follow up appointment in our office and any transportation needs.  MAKE SURE YOU:  Understand these instructions.  Get help right away if you are not doing well or get worse.    Pick up stool softner and laxative for home use following surgery while on pain medications. Do not submerge incision under water. Please use good hand washing techniques while changing dressing each  day. May shower starting three days after surgery. Please use a clean towel to pat the incision dry following showers. Continue to use ice for pain and swelling after surgery. Do not use any lotions or creams on the incision until instructed by your surgeon.   Discharge instructions   Complete by: As directed    Dr. Gaynelle Arabian Total Joint Specialist [facility] [address], [place], [place] [address] [phone]  ANTERIOR APPROACH TOTAL HIP REPLACEMENT POSTOPERATIVE DIRECTIONS   Hip Rehabilitation, Guidelines Following Surgery  The results of a hip operation are greatly improved after range of motion and muscle strengthening exercises. Follow all safety measures which are given to protect your hip. If any of these exercises cause increased pain or swelling in your joint, decrease the amount until you are comfortable again. Then slowly increase the exercises. Call your caregiver if you have problems or questions.   HOME CARE INSTRUCTIONS  Remove items at home which could result in a fall. This includes throw rugs or furniture in walking pathways.  ICE to the affected hip every three hours for 30 minutes at a time and then as needed for pain and swelling.  Continue to use ice on the hip for pain and swelling from surgery. You may notice swelling that will progress down to the foot and ankle.  This is normal after surgery.  Elevate the leg when you are not up walking on it.   Continue to use the breathing machine which will help keep your temperature down.  It is common for your temperature to cycle up and down following surgery,  especially at night when you are not up moving around and exerting yourself.  The breathing machine keeps your lungs expanded and your temperature down.   DIET You may resume your previous home diet once your are discharged from the hospital.  DRESSING / WOUND CARE / SHOWERING You may shower 3 days after surgery, but keep the wounds dry during  showering.  You may use an occlusive plastic wrap (Press'n Seal for example), NO SOAKING/SUBMERGING IN THE BATHTUB.  If the bandage gets wet, change with a clean dry gauze.  If the incision gets wet, pat the wound dry with a clean towel. You may start showering once you are discharged home but do not submerge the incision under water. Just pat the incision dry and apply a dry gauze dressing on daily. Change the surgical dressing daily and reapply a dry dressing each time.  ACTIVITY Walk with your walker as instructed. Use walker as long as suggested by your caregivers. Avoid periods of inactivity such as sitting longer than an hour when not asleep. This helps prevent blood clots.  You may resume a sexual relationship in one month or when given the OK by your doctor.  You may return to work once you are cleared by your doctor.  Do not drive a car for 6 weeks or until released by you surgeon.  Do not drive while taking narcotics.  WEIGHT BEARING Weight bearing as tolerated with assist device (walker, cane, etc) as directed, use it as long as suggested by your surgeon or therapist, typically at least 4-6 weeks.  POSTOPERATIVE CONSTIPATION PROTOCOL Constipation - defined medically as fewer than three stools per week and severe constipation as less than one stool per week.  One of the most common issues patients have following surgery is constipation.  Even if you have a regular bowel pattern at home, your normal regimen is likely to be disrupted due to multiple reasons following surgery.  Combination of anesthesia, postoperative narcotics, change in appetite and fluid intake all can affect your bowels.  In order to avoid complications following surgery, here are some recommendations in order to help you during your recovery period.  Colace (docusate) - Pick up an over-the-counter form of Colace or another stool softener and take twice a day as long as you are requiring postoperative pain  medications.  Take with a full glass of water daily.  If you experience loose stools or diarrhea, hold the colace until you stool forms back up.  If your symptoms do not get better within 1 week or if they get worse, check with your doctor.  Dulcolax (bisacodyl) - Pick up over-the-counter and take as directed by the product packaging as needed to assist with the movement of your bowels.  Take with a full glass of water.  Use this product as needed if not relieved by Colace only.   MiraLax (polyethylene glycol) - Pick up over-the-counter to have on hand.  MiraLax is a solution that will increase the amount of water in your bowels to assist with bowel movements.  Take as directed and can mix with a glass of water, juice, soda, coffee, or tea.  Take if you go more than two days without a movement. Do not use MiraLax more than once per day. Call your doctor if you are still constipated or irregular after using this medication for 7 days in a row.  If you continue to have problems with postoperative constipation, please contact the office for further assistance and  recommendations.  If you experience "the worst abdominal pain ever" or develop nausea or vomiting, please contact the office immediatly for further recommendations for treatment.  ITCHING  If you experience itching with your medications, try taking only a single pain pill, or even half a pain pill at a time.  You can also use Benadryl over the counter for itching or also to help with sleep.   TED HOSE STOCKINGS Wear the elastic stockings on both legs for three weeks following surgery during the day but you may remove then at night for sleeping.  MEDICATIONS See your medication summary on the "After Visit Summary" that the nursing staff will review with you prior to discharge.  You may have some home medications which will be placed on hold until you complete the course of blood thinner medication.  It is important for you to complete the blood  thinner medication as prescribed by your surgeon.  Continue your approved medications as instructed at time of discharge.  PRECAUTIONS If you experience chest pain or shortness of breath - call 911 immediately for transfer to the hospital emergency department.  If you develop a fever greater that 101 F, purulent drainage from wound, increased redness or drainage from wound, foul odor from the wound/dressing, or calf pain - CONTACT YOUR SURGEON.                                                   FOLLOW-UP APPOINTMENTS Make sure you keep all of your appointments after your operation with your surgeon and caregivers. You should call the office at the above phone number and make an appointment for approximately two weeks after the date of your surgery or on the date instructed by your surgeon outlined in the "After Visit Summary".  RANGE OF MOTION AND STRENGTHENING EXERCISES  These exercises are designed to help you keep full movement of your hip joint. Follow your caregiver's or physical therapist's instructions. Perform all exercises about fifteen times, three times per day or as directed. Exercise both hips, even if you have had only one joint replacement. These exercises can be done on a training (exercise) mat, on the floor, on a table or on a bed. Use whatever works the best and is most comfortable for you. Use music or television while you are exercising so that the exercises are a pleasant break in your day. This will make your life better with the exercises acting as a break in routine you can look forward to.  Lying on your back, slowly slide your foot toward your buttocks, raising your knee up off the floor. Then slowly slide your foot back down until your leg is straight again.  Lying on your back spread your legs as far apart as you can without causing discomfort.  Lying on your side, raise your upper leg and foot straight up from the floor as far as is comfortable. Slowly lower the leg and  repeat.  Lying on your back, tighten up the muscle in the front of your thigh (quadriceps muscles). You can do this by keeping your leg straight and trying to raise your heel off the floor. This helps strengthen the largest muscle supporting your knee.  Lying on your back, tighten up the muscles of your buttocks both with the legs straight and with the knee bent at a comfortable  angle while keeping your heel on the floor.   IF YOU ARE TRANSFERRED TO A SKILLED REHAB FACILITY If the patient is transferred to a skilled rehab facility following release from the hospital, a list of the current medications will be sent to the facility for the patient to continue.  When discharged from the skilled rehab facility, please have the facility set up the patient's [place] prior to being released. Also, the skilled facility will be responsible for providing the patient with their medications at time of release from the facility to include their pain medication, the muscle relaxants, and their blood thinner medication. If the patient is still at the rehab facility at time of the two week follow up appointment, the skilled rehab facility will also need to assist the patient in arranging follow up appointment in our office and any transportation needs.  MAKE SURE YOU:  Understand these instructions.  Get help right away if you are not doing well or get worse.    Pick up stool softner and laxative for home use following surgery while on pain medications. Do not submerge incision under water. Please use good hand washing techniques while changing dressing each day. May shower starting three days after surgery. Please use a clean towel to pat the incision dry following showers. Continue to use ice for pain and swelling after surgery. Do not use any lotions or creams on the incision until instructed by your surgeon.   Do not sit on low chairs, stoools or toilet seats, as it may be difficult to get  up from low surfaces   Complete by: As directed    Driving restrictions   Complete by: As directed    No driving for two weeks   Increase activity slowly as tolerated   Complete by: As directed    TED hose   Complete by: As directed    Use stockings (TED hose) for three weeks on both leg(s).  You may remove them at night for sleeping.   Weight bearing as tolerated   Complete by: As directed      Allergies as of 01/01/2019      Reactions   Peanut-containing Drug Products Hives   Fever blisters around mouth  "just peanuts"      Medication List    STOP taking these medications   HYDROcodone-acetaminophen 7.5-325 MG tablet Commonly known as: NORCO Replaced by: HYDROcodone-acetaminophen 5-325 MG tablet   naproxen sodium 220 MG tablet Commonly known as: ALEVE   tamsulosin 0.4 MG Caps capsule Commonly known as: FLOMAX     TAKE these medications   aspirin 325 MG EC tablet Take 1 tablet (325 mg total) by mouth 2 (two) times daily.   finasteride 5 MG tablet Commonly known as: PROSCAR Take 5 mg by mouth every morning.   HYDROcodone-acetaminophen 5-325 MG tablet Commonly known as: NORCO/VICODIN Take 1-2 tablets by mouth every 6 (six) hours as needed for severe pain. Replaces: HYDROcodone-acetaminophen 7.5-325 MG tablet   insulin lispro 100 UNIT/ML injection Commonly known as: HUMALOG Inject 10 Units into the skin 2 (two) times a day.   metFORMIN 1000 MG tablet Commonly known as: GLUCOPHAGE Take 1,000 mg by mouth daily with breakfast.   methocarbamol 500 MG tablet Commonly known as: ROBAXIN Take 1 tablet (500 mg total) by mouth every 6 (six) hours as needed for muscle spasms. Notes to patient: Muscle relaxer; as needed for muscle aches/spams or soreness   omeprazole 20 MG capsule Commonly known as:  PRILOSEC Take 20 mg by mouth every morning.   pioglitazone 30 MG tablet Commonly known as: ACTOS Take 30 mg by mouth every morning.   ramipril 2.5 MG capsule Commonly  known as: ALTACE Take 2.5 mg by mouth every morning.   rosuvastatin 20 MG tablet Commonly known as: CRESTOR Take 20 mg by mouth every morning.   Toujeo SoloStar 300 UNIT/ML Sopn Generic drug: Insulin Glargine (1 Unit Dial) Inject 45 Units as directed every morning. Took 33 units this am, at 0530-   traMADol 50 MG tablet Commonly known as: ULTRAM Take 1-2 tablets (50-100 mg total) by mouth every 6 (six) hours as needed for moderate pain.            Discharge Care Instructions  (From admission, onward)         Start     Ordered   12/31/18 0000  Weight bearing as tolerated     12/31/18 1715   12/31/18 0000  Change dressing    Comments: You may change your dressing on Friday, then change the dressing daily with sterile 4 x 4 inch gauze dressing and paper tape.   12/31/18 1715         Follow-up Information    Gaynelle Arabian, MD. Schedule an appointment as soon as possible for a visit on 01/15/2019.   Specialty: Orthopedic Surgery Contact information: [address] [place] [address] [phone]           Signed: Ardeen Jourdain, PA-C Orthopaedic Surgery 01/04/2019, 8:35 PM

## 2019-02-03 DIAGNOSIS — Z96641 Presence of right artificial hip joint: Secondary | ICD-10-CM | POA: Diagnosis not present

## 2019-02-03 DIAGNOSIS — M25561 Pain in right knee: Secondary | ICD-10-CM | POA: Diagnosis not present

## 2019-02-03 DIAGNOSIS — Z471 Aftercare following joint replacement surgery: Secondary | ICD-10-CM | POA: Diagnosis not present

## 2019-03-15 IMAGING — MR MR LUMBAR SPINE W/O CM
4 of 5 series · 18 of 48 positions shown · non-contrast
Comparison: None.

CLINICAL DATA: Low back pain. BILATERAL leg numbness. Symptoms for
several years, worsening.

EXAM:
MRI LUMBAR SPINE WITHOUT CONTRAST
TECHNIQUE: Multiplanar, multisequence MR imaging of the lumbar spine was
performed. No intravenous contrast was administered.

[Series 6: T2 · sagittal · 4.0mm · 0.73mm/px · 6 of 15 slices shown (1 of 2)]
[im 1/15]
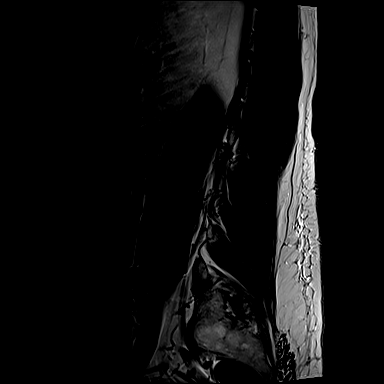
[im 3/15]
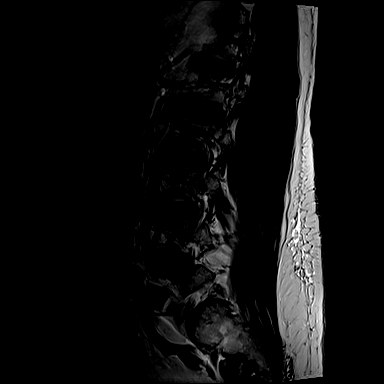
[im 6/15]
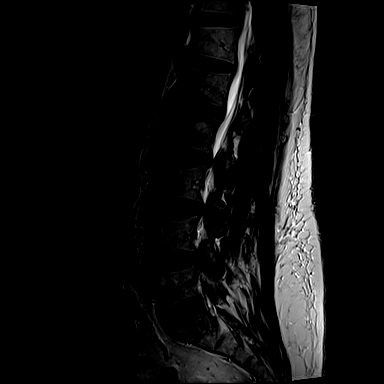
[im 9/15]
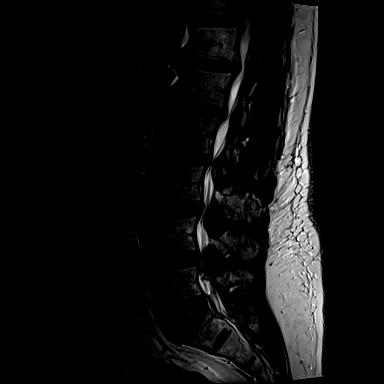
[im 12/15]
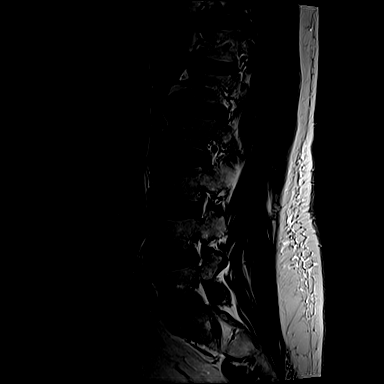
[im 15/15]
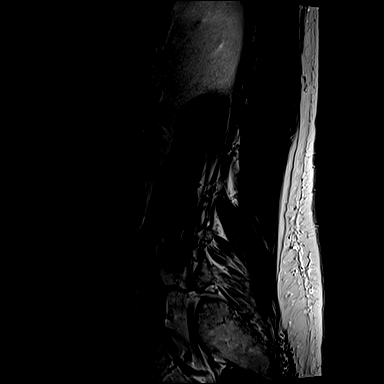

[Series 7: T1 · sagittal · 4.0mm · 0.73mm/px · 3 of 15 slices shown (1 of 2)]
[im 1/15]
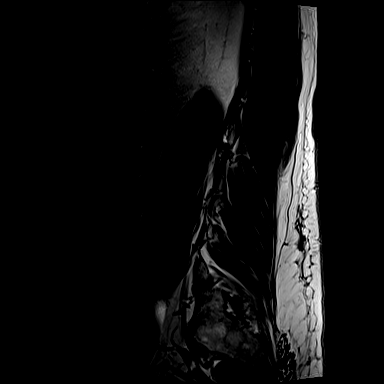
[im 8/15]
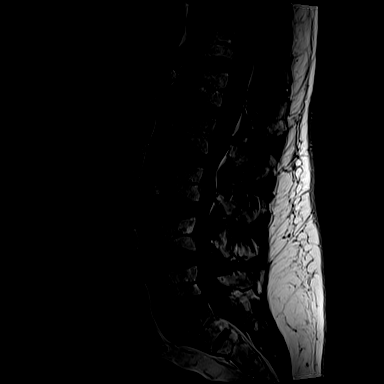
[im 15/15]
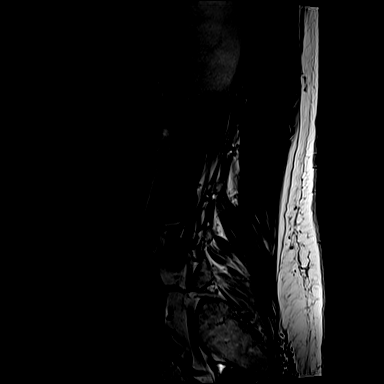

[Series 11: T1 · axial · 4.0mm · 0.28mm/px · z∈[-36,+137]mm · 3 of 44 slices shown (2 of 2)]
[im 6/44]
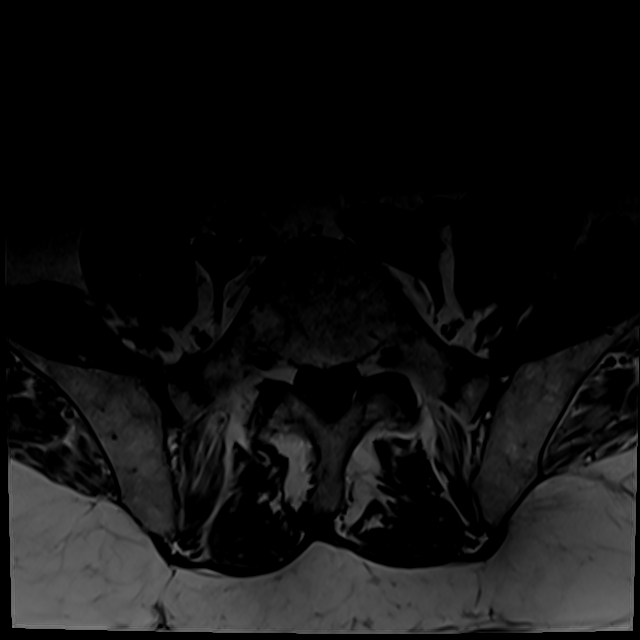
[im 23/44]
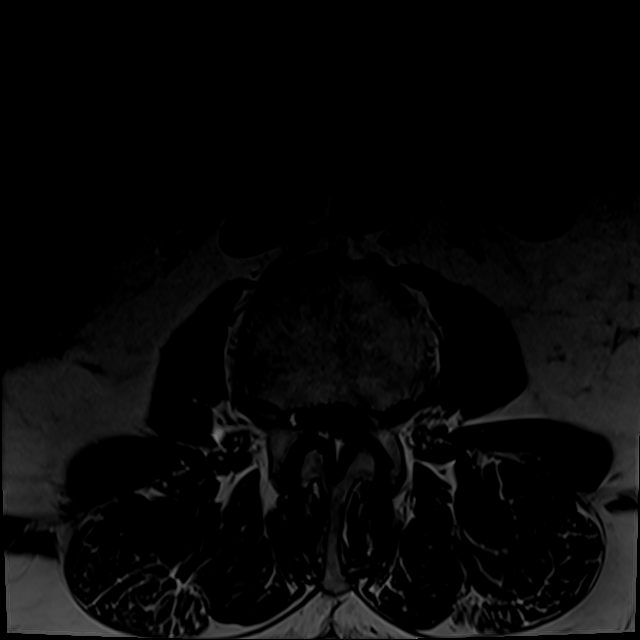
[im 38/44]
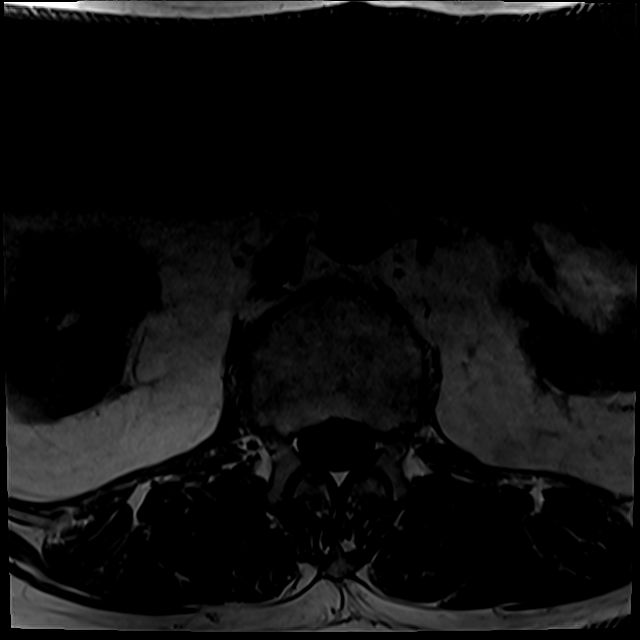

[Series 14: T2 · axial · 4.0mm · 0.28mm/px · z∈[-51,+137]mm · 6 of 44 slices shown (2 of 2)]
[im 3/44]
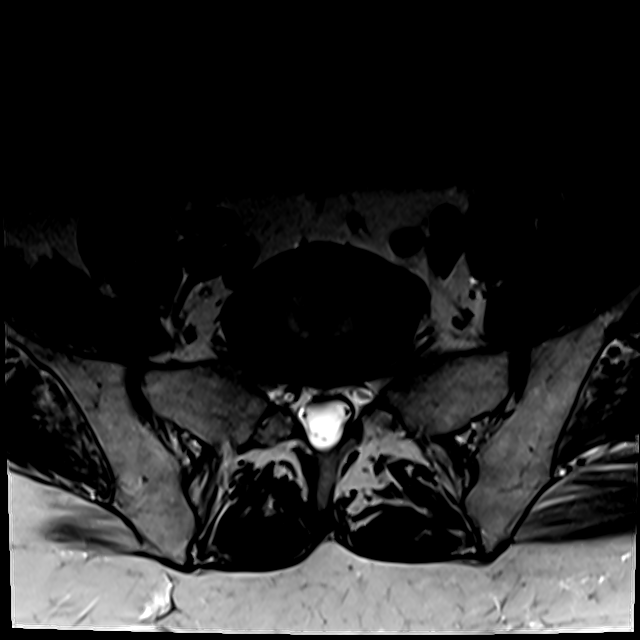
[im 6/44]
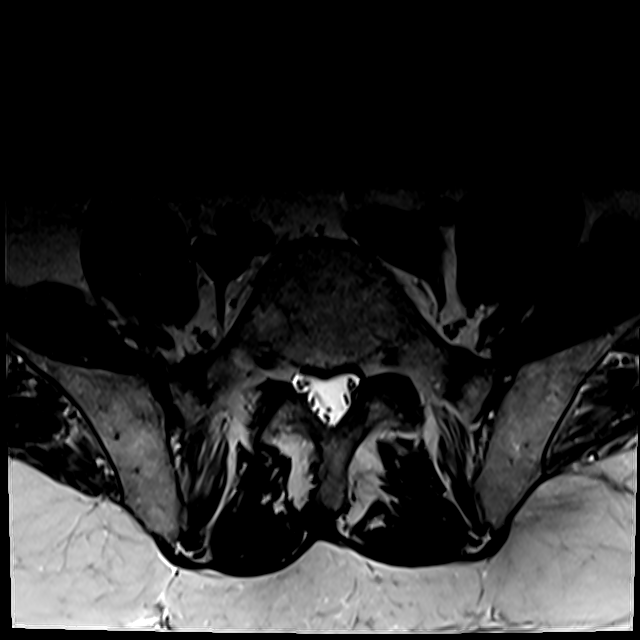
[im 9/44]
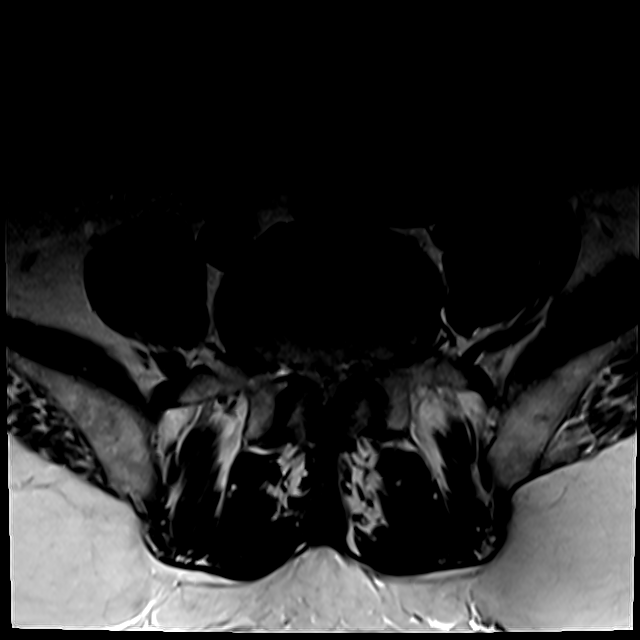
[im 15/44]
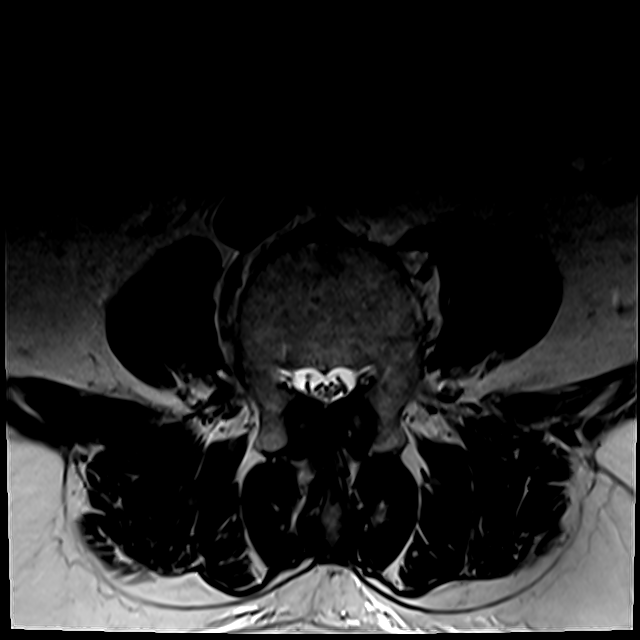
[im 23/44]
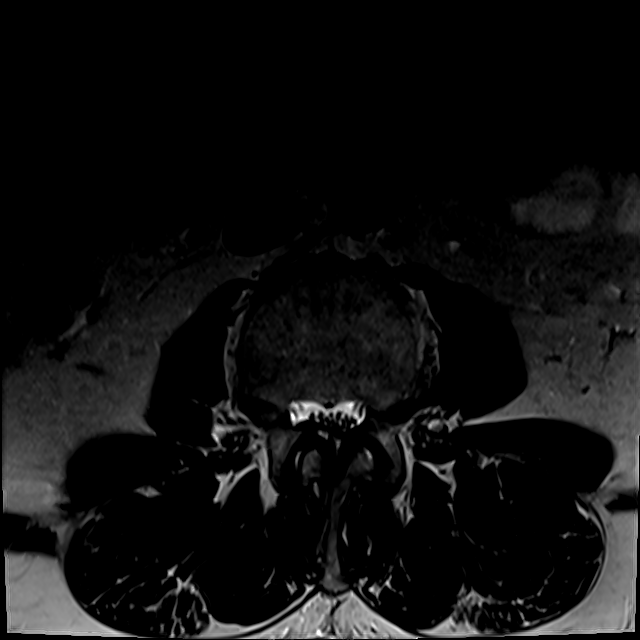
[im 38/44]
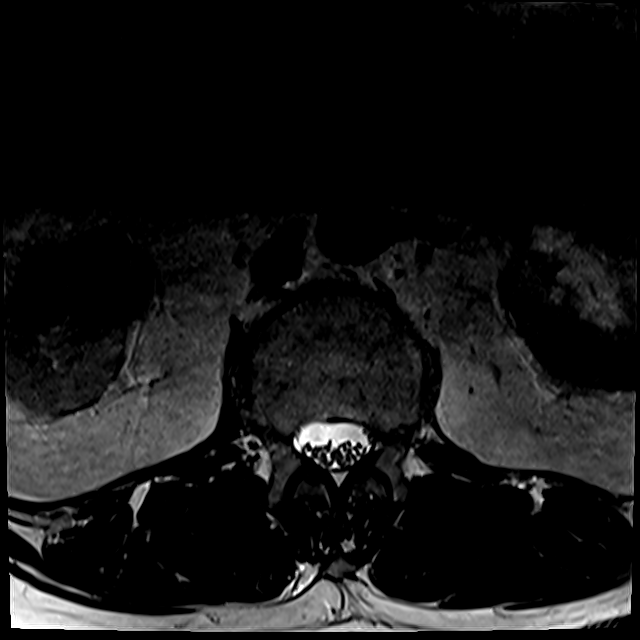

[18 of 48 positions shown; findings below may reference images not displayed]

FINDINGS: Segmentation:  Standard.

Alignment:  Anatomic

Vertebrae: No fracture, evidence of discitis, or bone lesion.
Congenitally short pedicles.

Conus medullaris and cauda equina: Conus extends to the L1 level.
Cauda equina nerve roots are serpiginous below the L4-5 stenosis,
described below.

Paraspinal and other soft tissues: Unremarkable.

Disc levels:

T12-L1: Central and rightward extrusion. Mild stenosis. No conus
compression. Mild subarticular zone narrowing could affect the RIGHT
L1 nerve root.

L1-L2:  Central protrusion.  Congenital stenosis.  No impingement.

L2-L3: Normal disc space. Short pedicles. Advanced facet arthropathy
and ligamentum flavum hypertrophy. Mild to moderate stenosis.
BILATERAL L3 nerve root impingement. Borderline foraminal narrowing.

L3-L4: Normal disc space. Short pedicles. Severe stenosis due to
posterior element hypertrophy. BILATERAL L4 nerve root impingement.
Borderline foraminal narrowing.

L4-L5: Annular bulge. Severe, near critical stenosis. Posterior
element hypertrophy. Short pedicles. BILATERAL L5 nerve root
impingement. Significant compression of all cauda equina nerve
roots. Borderline foraminal narrowing.

L5-S1:  Normal disc space.  Mild facet arthropathy.  No impingement.
IMPRESSION: Severe, near critical stenosis at L4-5, related to posterior element
hypertrophy, short pedicles, and annular bulge. BILATERAL L5 nerve
root impingement. Significant compression of all cauda equina nerve
roots. Similar less severe changes at L2-3 and L3-4. Surgical
consultation is warranted.

Central and rightward extrusion at T12-L1, could cause RIGHT-sided
radicular symptoms but is unlikely to could contribute to BILATERAL
leg numbness.

## 2019-03-16 DIAGNOSIS — Z0120 Encounter for dental examination and cleaning without abnormal findings: Secondary | ICD-10-CM | POA: Diagnosis not present

## 2019-04-11 IMAGING — CR DG LUMBAR SPINE 1V
1 series · 1 of 1 positions shown · non-contrast
Comparison: MR lumbar spine of 10/25/2016

CLINICAL DATA: For laminectomy and foraminotomy at L3-4 and L4-5

EXAM:
LUMBAR SPINE - 1 VIEW

[lateral]
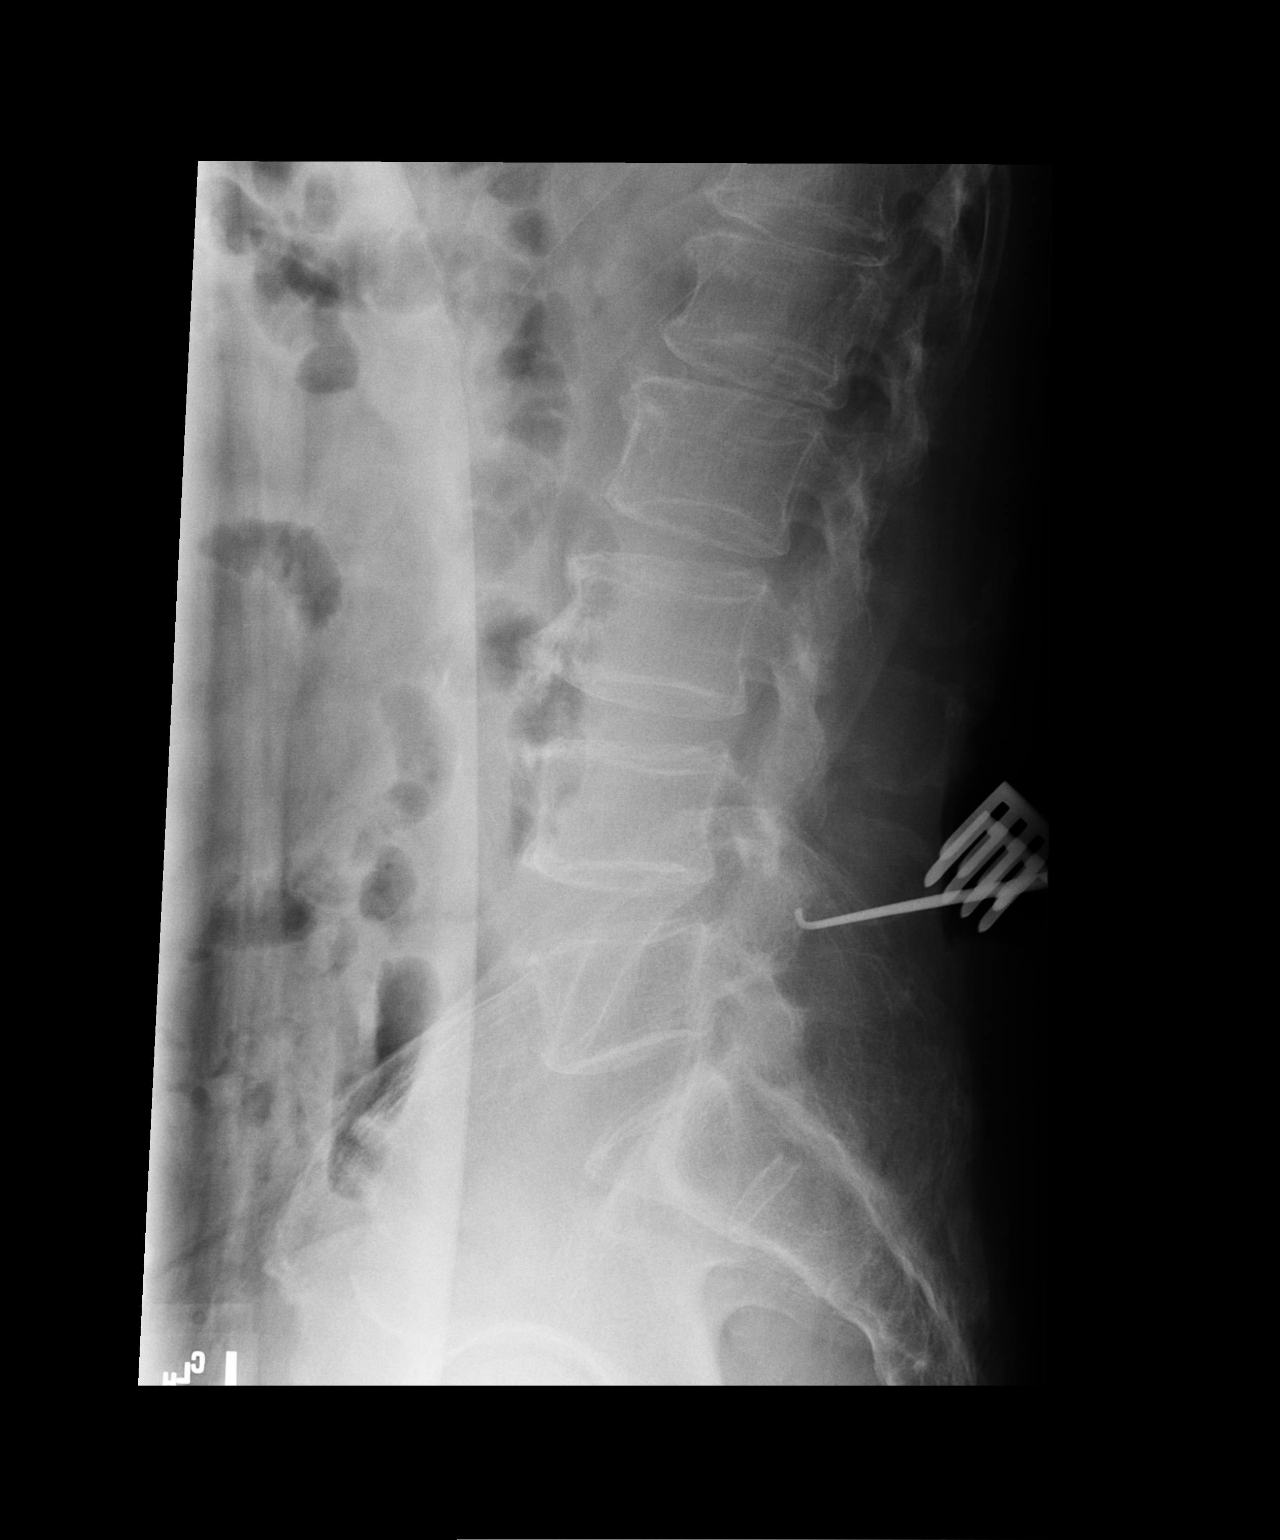

[1 of 1 positions shown; findings below may reference images not displayed]

FINDINGS: The instrument positioned posteriorly is directed beneath the
spinous process of L4, directed toward the L4-5 interspace.
IMPRESSION: Localization of L4-5 interspace

## 2019-04-24 DIAGNOSIS — M810 Age-related osteoporosis without current pathological fracture: Secondary | ICD-10-CM | POA: Diagnosis not present

## 2019-04-24 DIAGNOSIS — E1159 Type 2 diabetes mellitus with other circulatory complications: Secondary | ICD-10-CM | POA: Diagnosis not present

## 2019-04-24 DIAGNOSIS — M4800 Spinal stenosis, site unspecified: Secondary | ICD-10-CM | POA: Diagnosis not present

## 2019-04-24 DIAGNOSIS — Z794 Long term (current) use of insulin: Secondary | ICD-10-CM | POA: Diagnosis not present

## 2019-08-03 DIAGNOSIS — E119 Type 2 diabetes mellitus without complications: Secondary | ICD-10-CM | POA: Diagnosis not present

## 2019-08-03 DIAGNOSIS — H2513 Age-related nuclear cataract, bilateral: Secondary | ICD-10-CM | POA: Diagnosis not present

## 2019-08-03 DIAGNOSIS — H02831 Dermatochalasis of right upper eyelid: Secondary | ICD-10-CM | POA: Diagnosis not present

## 2019-08-03 DIAGNOSIS — H02834 Dermatochalasis of left upper eyelid: Secondary | ICD-10-CM | POA: Diagnosis not present

## 2019-08-03 DIAGNOSIS — H524 Presbyopia: Secondary | ICD-10-CM | POA: Diagnosis not present

## 2019-08-28 DIAGNOSIS — M810 Age-related osteoporosis without current pathological fracture: Secondary | ICD-10-CM | POA: Diagnosis not present

## 2019-08-28 DIAGNOSIS — N401 Enlarged prostate with lower urinary tract symptoms: Secondary | ICD-10-CM | POA: Diagnosis not present

## 2019-08-28 DIAGNOSIS — E1159 Type 2 diabetes mellitus with other circulatory complications: Secondary | ICD-10-CM | POA: Diagnosis not present

## 2019-08-28 DIAGNOSIS — Z794 Long term (current) use of insulin: Secondary | ICD-10-CM | POA: Diagnosis not present

## 2019-09-14 DIAGNOSIS — Z0120 Encounter for dental examination and cleaning without abnormal findings: Secondary | ICD-10-CM | POA: Diagnosis not present

## 2019-12-30 DIAGNOSIS — E7849 Other hyperlipidemia: Secondary | ICD-10-CM | POA: Diagnosis not present

## 2019-12-30 DIAGNOSIS — M810 Age-related osteoporosis without current pathological fracture: Secondary | ICD-10-CM | POA: Diagnosis not present

## 2019-12-30 DIAGNOSIS — E119 Type 2 diabetes mellitus without complications: Secondary | ICD-10-CM | POA: Diagnosis not present

## 2019-12-30 DIAGNOSIS — Z0000 Encounter for general adult medical examination without abnormal findings: Secondary | ICD-10-CM | POA: Diagnosis not present

## 2019-12-31 DIAGNOSIS — Z96641 Presence of right artificial hip joint: Secondary | ICD-10-CM | POA: Diagnosis not present

## 2019-12-31 DIAGNOSIS — Z471 Aftercare following joint replacement surgery: Secondary | ICD-10-CM | POA: Diagnosis not present

## 2020-01-06 DIAGNOSIS — Z794 Long term (current) use of insulin: Secondary | ICD-10-CM | POA: Diagnosis not present

## 2020-01-06 DIAGNOSIS — Z0000 Encounter for general adult medical examination without abnormal findings: Secondary | ICD-10-CM | POA: Diagnosis not present

## 2020-01-06 DIAGNOSIS — R82998 Other abnormal findings in urine: Secondary | ICD-10-CM | POA: Diagnosis not present

## 2020-01-06 DIAGNOSIS — E1159 Type 2 diabetes mellitus with other circulatory complications: Secondary | ICD-10-CM | POA: Diagnosis not present

## 2020-01-06 DIAGNOSIS — M810 Age-related osteoporosis without current pathological fracture: Secondary | ICD-10-CM | POA: Diagnosis not present

## 2020-02-09 DIAGNOSIS — Z1211 Encounter for screening for malignant neoplasm of colon: Secondary | ICD-10-CM | POA: Diagnosis not present

## 2020-02-09 DIAGNOSIS — Z1212 Encounter for screening for malignant neoplasm of rectum: Secondary | ICD-10-CM | POA: Diagnosis not present

## 2020-02-09 LAB — COLOGUARD: Cologuard: POSITIVE — AB

## 2020-03-28 DIAGNOSIS — Z0120 Encounter for dental examination and cleaning without abnormal findings: Secondary | ICD-10-CM | POA: Diagnosis not present

## 2020-03-30 DIAGNOSIS — D485 Neoplasm of uncertain behavior of skin: Secondary | ICD-10-CM | POA: Diagnosis not present

## 2020-03-30 DIAGNOSIS — D045 Carcinoma in situ of skin of trunk: Secondary | ICD-10-CM | POA: Diagnosis not present

## 2020-03-30 DIAGNOSIS — L281 Prurigo nodularis: Secondary | ICD-10-CM | POA: Diagnosis not present

## 2020-03-30 DIAGNOSIS — D692 Other nonthrombocytopenic purpura: Secondary | ICD-10-CM | POA: Diagnosis not present

## 2020-03-30 DIAGNOSIS — L570 Actinic keratosis: Secondary | ICD-10-CM | POA: Diagnosis not present

## 2020-05-03 ENCOUNTER — Ambulatory Visit: Payer: Medicare Other | Admitting: Internal Medicine

## 2020-05-03 ENCOUNTER — Encounter: Payer: Self-pay | Admitting: Internal Medicine

## 2020-05-03 VITALS — BP 140/62 | HR 92 | Ht 68.0 in | Wt 206.2 lb

## 2020-05-03 DIAGNOSIS — Z86010 Personal history of colonic polyps: Secondary | ICD-10-CM | POA: Diagnosis not present

## 2020-05-03 DIAGNOSIS — R195 Other fecal abnormalities: Secondary | ICD-10-CM

## 2020-05-03 MED ORDER — NA SULFATE-K SULFATE-MG SULF 17.5-3.13-1.6 GM/177ML PO SOLN: 1.0000 | Freq: Once | ORAL | 0 refills | Status: AC

## 2020-05-03 NOTE — Patient Instructions (Signed)
Normal BMI (Body Mass Index- based on height and weight) is between 23 and 30. Your BMI today is Body mass index is 31.35 kg/m. Marland Kitchen Please consider follow up  regarding your BMI with your Primary Care Provider.   You have been scheduled for a colonoscopy. Please follow written instructions given to you at your visit today.  Please pick up your prep supplies at the pharmacy within the next 1-3 days. If you use inhalers (even only as needed), please bring them with you on the day of your procedure.   I appreciate the opportunity to care for you. Silvano Rusk, MD, [facility]

## 2020-05-03 NOTE — Progress Notes (Signed)
Zachary Vaughn 79 y.o. January 05, 1941 [id number]  Assessment & Plan:   Encounter Diagnoses  Name Primary?  . Positive colorectal cancer screening using Cologuard test Yes  . Hx of colonic polyps     Schedule colonoscopy to evaluate the positive Cologuard in the setting of history of colon polyps.  The risks and benefits as well as alternatives of endoscopic procedure(s) have been discussed and reviewed. All questions answered. The patient agrees to proceed.  I appreciate the opportunity to care for this patient. CC: Crist Infante, MD    Subjective:   Chief Complaint: Positive Cologuard  HPI Zachary Vaughn is a 79 year old white man with a history of colon polyps who had a Cologuard test through primary care.  He has not seen any rectal bleeding had any bowel habit changes or new abdominal pain.  Last colonoscopy with 2 subcentimeter hyperplastic polyps on the ileocecal valve, random colon biopsies negative as this was a work-up for diarrhea.  He had had adenomas in 2009 and 2004.  Repeat colonoscopy was recommended in 5 years. Allergies  Allergen Reactions  . Peanut-Containing Drug Products Hives    Fever blisters around mouth  "just peanuts"   Current Meds  Medication Sig  . finasteride (PROSCAR) 5 MG tablet Take 5 mg by mouth every morning.   Marland Kitchen HUMALOG KWIKPEN 100 UNIT/ML KwikPen Inject 10 Units into the skin in the morning and at bedtime.   . metFORMIN (GLUCOPHAGE) 1000 MG tablet Take 1,000 mg by mouth daily with breakfast.   . omeprazole (PRILOSEC) 20 MG capsule Take 20 mg by mouth every morning.   . pioglitazone (ACTOS) 30 MG tablet Take 30 mg by mouth every morning.   . ramipril (ALTACE) 2.5 MG capsule Take 2.5 mg by mouth every morning.   . rosuvastatin (CRESTOR) 20 MG tablet Take 20 mg by mouth every morning.   Marland Kitchen TOUJEO SOLOSTAR 300 UNIT/ML SOPN Inject 45 Units as directed every morning.    Past Medical History:  Diagnosis Date  . Actinic keratosis   . Allergic rhinitis     seasonal   . 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 Colon polyps   . Complication of anesthesia    per Pt after back surgery 05/ 2019 couldn't sleep x 7 days, hypervigilant  . DDD (degenerative disc disease), lumbar   . Diverticulosis   . Erectile dysfunction   . GERD (gastroesophageal reflux disease)   . History of bladder stone   . History of kidney stones   . Hyperlipidemia   . Hypertension   . OA (osteoarthritis)    hips  . Osteoporosis   . Spinal stenosis   . Spondylosis   . Type 2 diabetes mellitus treated with insulin ([place])    followed by pcp  . Wears glasses    Past Surgical History:  Procedure Laterality Date  . APPENDECTOMY  1975  . COLONOSCOPY    . CYSTOSCOPY WITH LITHOLAPAXY N/A 02/14/2018   Procedure: CYSTOSCOPY WITH LITHOLAPAXY;  Surgeon: Festus Aloe, MD;  Location: [facility];  Service: Urology;  Laterality: N/A;  ONLY NEEDS 90 MIN TOTAL FOR ALL PROCEDURES  . FRACTURE SURGERY Left 05/ 10/ 2010   ROD DOWN IN LEFT LEG;   tib-fib fx's;   per pt hardware remains  . LUMBAR LAMINECTOMY/DECOMPRESSION MICRODISCECTOMY Bilateral 11/21/2017   Procedure: Laminectomy and Foraminotomy - Lumbar Three-Lumbar Four - Lumbar Four-Lumbar Five - bilateral;  Surgeon: Eustace Moore, MD;  Location: [facility];  Service: Neurosurgery;  Laterality: Bilateral;  Laminectomy and Foraminotomy - Lumbar Three-Lumbar Four - Lumbar Four-Lumbar Five - bilateral  . THULIUM LASER TURP (TRANSURETHRAL RESECTION OF PROSTATE) N/A 02/14/2018   Procedure: THULIUM LASER TURP (TRANSURETHRAL RESECTION OF PROSTATE);  Surgeon: Festus Aloe, MD;  Location: [facility];  Service: Urology;  Laterality: N/A;  . TOTAL HIP ARTHROPLASTY Right 12/31/2018   Procedure: TOTAL HIP ARTHROPLASTY ANTERIOR APPROACH;  Surgeon: Gaynelle Arabian, MD;  Location: [facility];  Service: Orthopedics;  Laterality: Right;  152min   Social History   Social History Narrative     Patient is retired from [employer].  He is divorced and has 2 children.   Never smoker nondrinker no drug use   family history includes Alcoholism in his father; Liver disease in his father; Pancreatic cancer in his sister; Pulmonary fibrosis in his mother.   Review of Systems As per HPI  Objective:   Physical Exam BP 140/62   Pulse 92   Ht 5\' 8"  (1.727 m)   Wt 206 lb 3.2 oz (93.5 kg)   BMI 31.35 kg/m  No acute distress elderly white man Lungs clear Normal heart sounds

## 2020-05-05 ENCOUNTER — Encounter: Payer: Self-pay | Admitting: Internal Medicine

## 2020-05-06 ENCOUNTER — Other Ambulatory Visit: Payer: Self-pay

## 2020-05-06 ENCOUNTER — Encounter: Payer: Self-pay | Admitting: Internal Medicine

## 2020-05-06 ENCOUNTER — Ambulatory Visit (AMBULATORY_SURGERY_CENTER): Payer: Medicare Other | Admitting: Internal Medicine

## 2020-05-06 VITALS — BP 121/68 | HR 84 | Temp 97.1°F | Resp 13 | Ht 68.0 in | Wt 206.0 lb

## 2020-05-06 DIAGNOSIS — Z1211 Encounter for screening for malignant neoplasm of colon: Secondary | ICD-10-CM | POA: Diagnosis not present

## 2020-05-06 DIAGNOSIS — R195 Other fecal abnormalities: Secondary | ICD-10-CM | POA: Diagnosis not present

## 2020-05-06 DIAGNOSIS — D123 Benign neoplasm of transverse colon: Secondary | ICD-10-CM | POA: Diagnosis not present

## 2020-05-06 DIAGNOSIS — K649 Unspecified hemorrhoids: Secondary | ICD-10-CM

## 2020-05-06 DIAGNOSIS — K5730 Diverticulosis of large intestine without perforation or abscess without bleeding: Secondary | ICD-10-CM

## 2020-05-06 DIAGNOSIS — D124 Benign neoplasm of descending colon: Secondary | ICD-10-CM

## 2020-05-06 MED ORDER — SODIUM CHLORIDE 0.9 % IV SOLN: 500.0000 mL | Freq: Once | INTRAVENOUS | Status: DC

## 2020-05-06 NOTE — Progress Notes (Signed)
Called to room to assist during endoscopic procedure.  Patient ID and intended procedure confirmed with present staff. Received instructions for my participation in the procedure from the performing physician.  

## 2020-05-06 NOTE — Progress Notes (Signed)
VS by Macy. 

## 2020-05-06 NOTE — Patient Instructions (Addendum)
I removed 4 tiny polyps - no cancer seen. Swollen hemorrhoids noted also - they should shrink up some as the prep will irritate these. They could have leaked some blood causing test to be + Still have diverticulosis.  Bottom line is nothing bad.  You do not need any more colonoscopy tests or stool tests like Cologuard so do not do those if offered.  I appreciate the opportunity to care for you. Gatha Mayer, MD, [facility]  Polyp, diverticulosis, and hemorrhoid handouts given to patient.  YOU HAD AN ENDOSCOPIC PROCEDURE TODAY AT [facility]:   Refer to the procedure report that was given to you for any specific questions about what was found during the examination.  If the procedure report does not answer your questions, please call your gastroenterologist to clarify.  If you requested that your care partner not be given the details of your procedure findings, then the procedure report has been included in a sealed envelope for you to review at your convenience later.  YOU SHOULD EXPECT: Some feelings of bloating in the abdomen. Passage of more gas than usual.  Walking can help get rid of the air that was put into your GI tract during the procedure and reduce the bloating. If you had a lower endoscopy (such as a colonoscopy or flexible sigmoidoscopy) you may notice spotting of blood in your stool or on the toilet paper. If you underwent a bowel prep for your procedure, you may not have a normal bowel movement for a few days.  Please Note:  You might notice some irritation and congestion in your nose or some drainage.  This is from the oxygen used during your procedure.  There is no need for concern and it should clear up in a day or so.  SYMPTOMS TO REPORT IMMEDIATELY:   Following lower endoscopy (colonoscopy or flexible sigmoidoscopy):  Excessive amounts of blood in the stool  Significant tenderness or worsening of abdominal pains  Swelling of the abdomen that is new,  acute  Fever of 100F or higher For urgent or emergent issues, a gastroenterologist can be reached at any hour by calling [phone]. Do not use MyChart messaging for urgent concerns.    DIET:  We do recommend a small meal at first, but then you may proceed to your regular diet.  Drink plenty of fluids but you should avoid alcoholic beverages for 24 hours.  ACTIVITY:  You should plan to take it easy for the rest of today and you should NOT DRIVE or use heavy machinery until tomorrow (because of the sedation medicines used during the test).    FOLLOW UP: Our staff will call the number listed on your records 48-72 hours following your procedure to check on you and address any questions or concerns that you may have regarding the information given to you following your procedure. If we do not reach you, we will leave a message.  We will attempt to reach you two times.  During this call, we will ask if you have developed any symptoms of COVID 19. If you develop any symptoms (ie: fever, flu-like symptoms, shortness of breath, cough etc.) before then, please call [phone].  If you test positive for Covid 19 in the 2 weeks post procedure, please call and report this information to [place].    If any biopsies were taken you will be contacted by phone or by letter within the next 1-3 weeks.  Please call us at [phone] if  you have not heard about the biopsies in 3 weeks.    SIGNATURES/CONFIDENTIALITY: You and/or your care partner have signed paperwork which will be entered into your electronic medical record.  These signatures attest to the fact that that the information above on your After Visit Summary has been reviewed and is understood.  Full responsibility of the confidentiality of this discharge information lies with you and/or your care-partner.

## 2020-05-06 NOTE — Progress Notes (Signed)
A/ox3, pleased with MAC, report to RN 

## 2020-05-06 NOTE — Op Note (Signed)
[facility] Patient Name: Zachary Vaughn Procedure Date: 05/06/2020 2:33 PM MRN: [id number] Endoscopist: Gatha Mayer , MD Age: 79 Referring MD:  Date of Birth: Nov 14, 1940 Gender: Male Account #: [id number] Procedure:                Colonoscopy Indications:              Positive Cologuard test Medicines:                Propofol per Anesthesia, Monitored Anesthesia Care Procedure:                Pre-Anesthesia Assessment:                           - Prior to the procedure, a History and Physical                            was performed, and patient medications and                            allergies were reviewed. The patient's tolerance of                            previous anesthesia was also reviewed. The risks                            and benefits of the procedure and the sedation                            options and risks were discussed with the patient.                            All questions were answered, and informed consent                            was obtained. Prior Anticoagulants: The patient has                            taken no previous anticoagulant or antiplatelet                            agents. ASA Grade Assessment: II - A patient with                            mild systemic disease. After reviewing the risks                            and benefits, the patient was deemed in                            satisfactory condition to undergo the procedure.                           After obtaining informed consent, the colonoscope  was passed under direct vision. Throughout the                            procedure, the patient's blood pressure, pulse, and                            oxygen saturations were monitored continuously. The                            Colonoscope was introduced through the anus and                            advanced to the the cecum, identified by                            appendiceal orifice and  ileocecal valve. The                            colonoscopy was performed without difficulty. The                            patient tolerated the procedure well. The quality                            of the bowel preparation was good. The ileocecal                            valve, appendiceal orifice, and rectum were                            photographed. Scope In: 3:08:05 PM Scope Out: 3:22:30 PM Scope Withdrawal Time: 0 hours 12 minutes 20 seconds  Total Procedure Duration: 0 hours 14 minutes 25 seconds  Findings:                 The perianal and digital rectal examinations were                            normal.                           Four sessile polyps were found in the descending                            colon and transverse colon. The polyps were                            diminutive in size. These polyps were removed with                            a cold snare. Resection was complete, but the polyp                            tissue was only partially retrieved. Verification  of patient identification for the specimen was                            done. Estimated blood loss was minimal.                           Many small and large-mouthed diverticula were found                            in the sigmoid colon.                           Internal hemorrhoids were found.                           The exam was otherwise without abnormality on                            direct and retroflexion views. Complications:            No immediate complications. Estimated Blood Loss:     Estimated blood loss was minimal. Impression:               - Four diminutive polyps in the sigmoid colon and                            in the descending colon, removed with a cold snare.                            Complete resection. Partial retrieval. Two of four                            recovered.                           - Diverticulosis in the sigmoid colon.                            - Internal hemorrhoids.                           - The examination was otherwise normal on direct                            and retroflexion views. Recommendation:           - Patient has a contact number available for                            emergencies. The signs and symptoms of potential                            delayed complications were discussed with the                            patient. Return to normal activities tomorrow.  Written discharge instructions were provided to the                            patient.                           - Resume previous diet.                           - Continue present medications.                           - No repeat colonoscopy due to age. No repeat stool                            testiong (iFOBT, Cologuard) Gatha Mayer, MD 05/06/2020 3:45:15 PM This report has been signed electronically.

## 2020-05-10 ENCOUNTER — Telehealth: Payer: Self-pay | Admitting: *Deleted

## 2020-05-10 NOTE — Telephone Encounter (Signed)
°  Follow up Call-  Call back number 05/06/2020  Post procedure Call Back phone  # [phone]  Permission to leave phone message Yes  Some recent data might be hidden     Patient questions:  Do you have a fever, pain , or abdominal swelling? No. Pain Score  0 *  Have you tolerated food without any problems? Yes.    Have you been able to return to your normal activities? Yes.    Do you have any questions about your discharge instructions: Diet   No. Medications  No. Follow up visit  No.  Do you have questions or concerns about your Care? No.  Actions: * If pain score is 4 or above: No action needed, pain <4.  Have you developed a fever since your procedure? No  2.   Have you had an respiratory symptoms (SOB or cough) since your procedure? no  3.   Have you tested positive for COVID 19 since your procedure no  4.   Have you had any family members/close contacts diagnosed with the COVID 19 since your procedure?  no   If yes to any of these questions please route to Joylene John, RN and Joella Prince, RN

## 2020-05-15 ENCOUNTER — Encounter: Payer: Self-pay | Admitting: Internal Medicine

## 2020-05-15 DIAGNOSIS — Z86010 Personal history of colonic polyps: Secondary | ICD-10-CM

## 2020-05-20 IMAGING — RF OPERATIVE RIGHT HIP WITH PELVIS
1 series · 2 of 2 positions shown · non-contrast
Comparison: None

Fluoroscopy time: 1 0 minutes 11 seconds

Images: 2

Dose: 0.99 mGy

CLINICAL DATA: RIGHT hip replacement anterior approach

EXAM:
OPERATIVE RIGHT HIP (WITH PELVIS IF PERFORMED) 2 VIEWS
TECHNIQUE: Fluoroscopic spot image(s) were submitted for interpretation
post-operatively.

[Series 1: run · 2 of 2 slices shown]
[im 1/2]
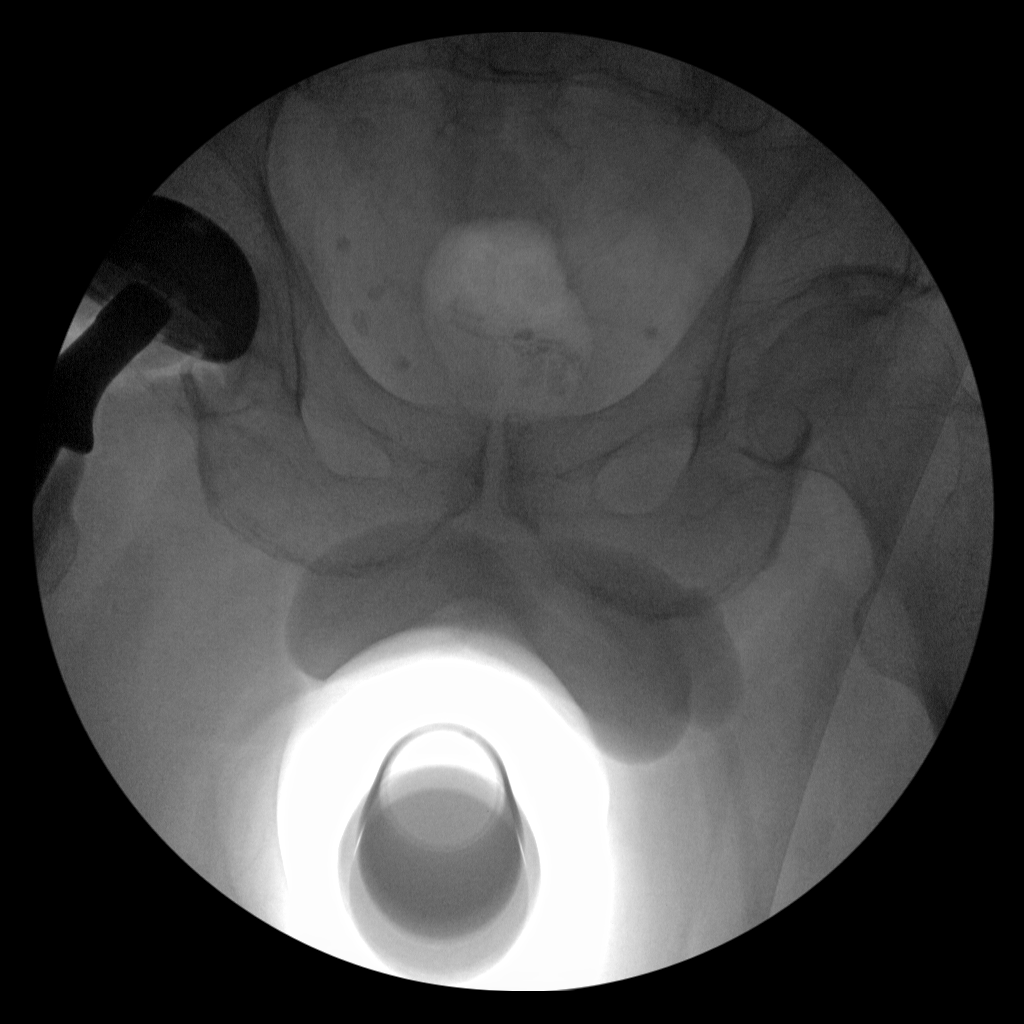
[im 2/2]
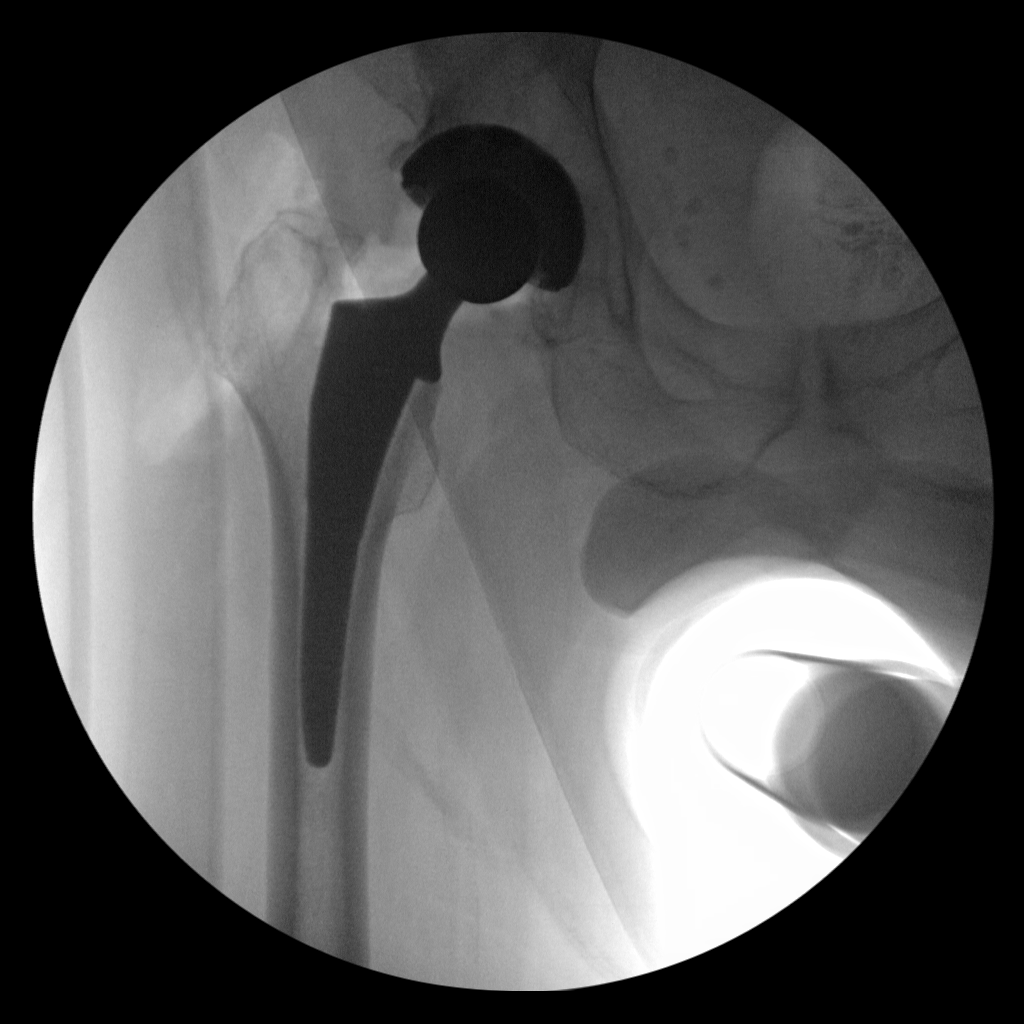

[2 of 2 positions shown; findings below may reference images not displayed]

FINDINGS: Components of a RIGHT hip prosthesis are identified in expected
position.

No fracture, dislocation, or bone destruction.
IMPRESSION: RIGHT hip prosthesis without acute complication.

## 2020-05-20 IMAGING — DX PORTABLE PELVIS 1-2 VIEWS
1 series · 1 of 1 positions shown · non-contrast
Comparison: 12/31/2018

CLINICAL DATA: Right hip replacement

EXAM:
PORTABLE PELVIS 1-2 VIEWS

[pelvis ap]
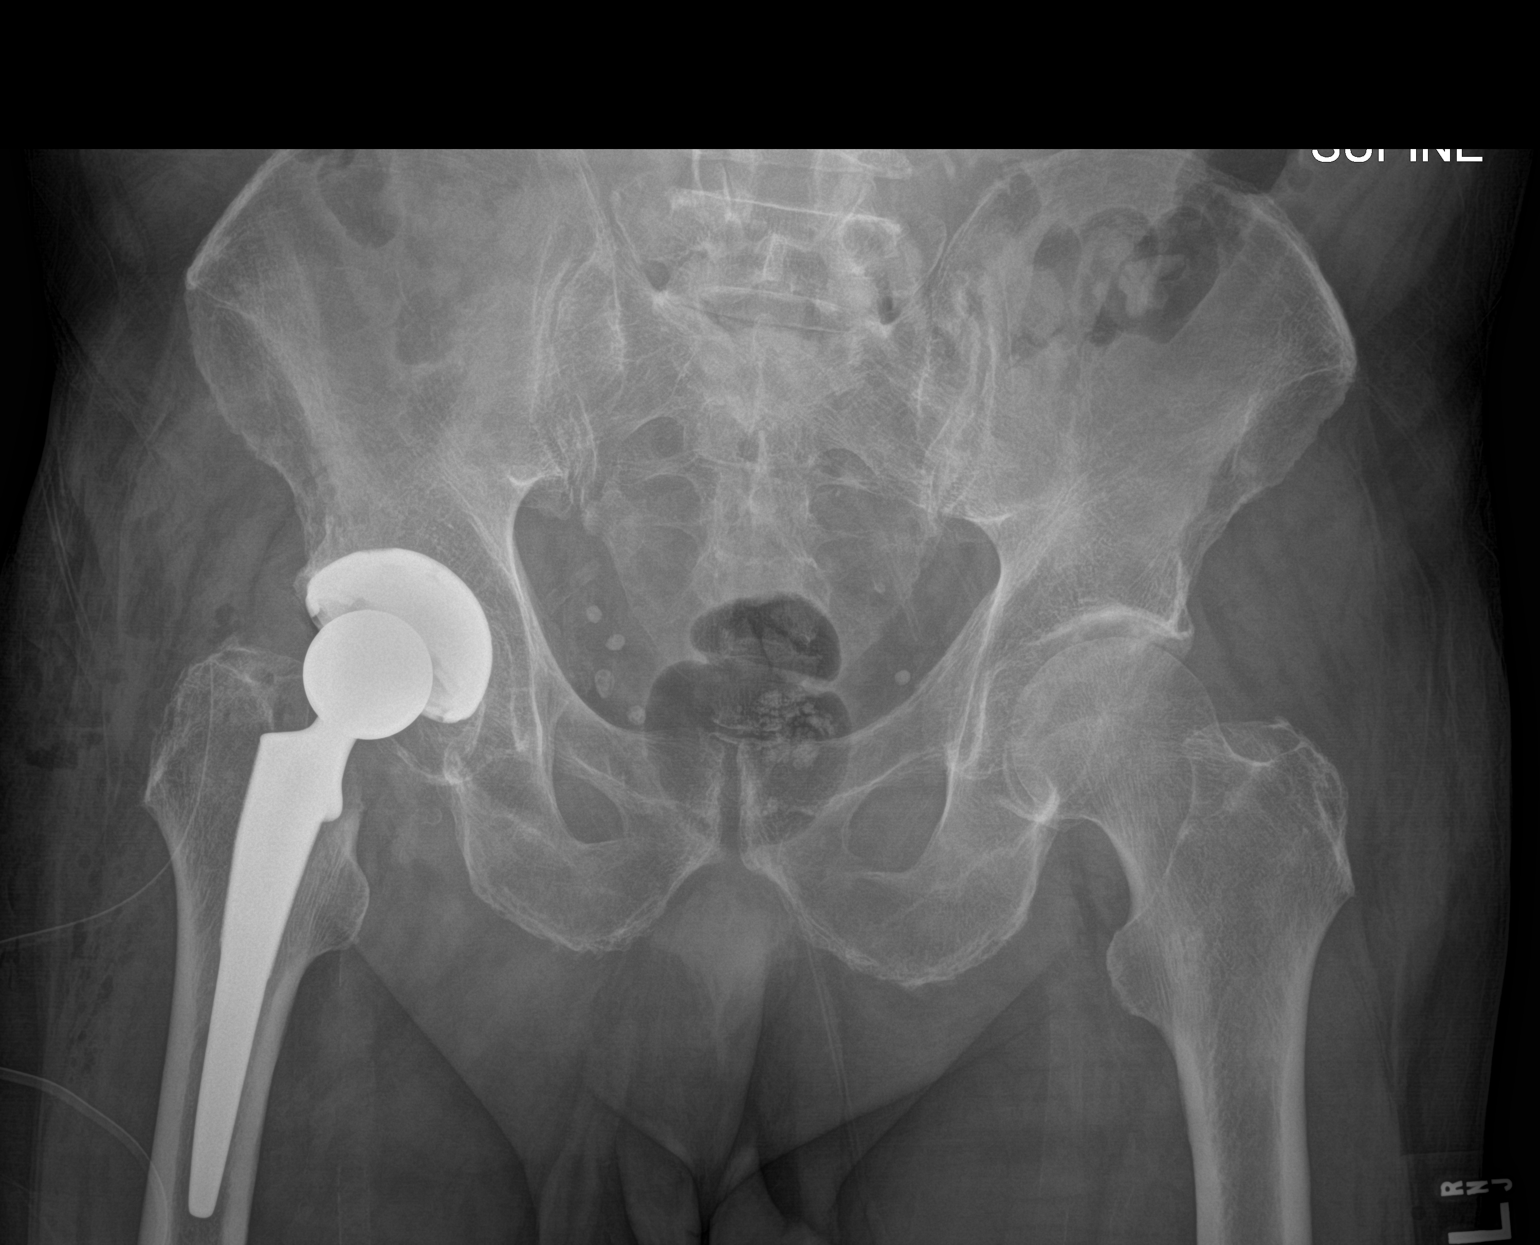

[1 of 1 positions shown; findings below may reference images not displayed]

FINDINGS: Right hip replacement in satisfactory position alignment. No
fracture or complication.

Lumbar laminectomy.
IMPRESSION: Satisfactory right hip replacement.

## 2020-05-23 DIAGNOSIS — R059 Cough, unspecified: Secondary | ICD-10-CM | POA: Diagnosis not present

## 2020-05-23 DIAGNOSIS — J40 Bronchitis, not specified as acute or chronic: Secondary | ICD-10-CM | POA: Diagnosis not present

## 2020-05-23 DIAGNOSIS — E663 Overweight: Secondary | ICD-10-CM | POA: Diagnosis not present

## 2020-05-23 DIAGNOSIS — K219 Gastro-esophageal reflux disease without esophagitis: Secondary | ICD-10-CM | POA: Diagnosis not present

## 2020-06-14 ENCOUNTER — Other Ambulatory Visit: Payer: Self-pay | Admitting: Internal Medicine

## 2020-06-14 DIAGNOSIS — J986 Disorders of diaphragm: Secondary | ICD-10-CM

## 2020-07-04 DIAGNOSIS — E1159 Type 2 diabetes mellitus with other circulatory complications: Secondary | ICD-10-CM | POA: Diagnosis not present

## 2020-07-04 DIAGNOSIS — I10 Essential (primary) hypertension: Secondary | ICD-10-CM | POA: Diagnosis not present

## 2020-07-04 DIAGNOSIS — E785 Hyperlipidemia, unspecified: Secondary | ICD-10-CM | POA: Diagnosis not present

## 2020-07-04 DIAGNOSIS — M810 Age-related osteoporosis without current pathological fracture: Secondary | ICD-10-CM | POA: Diagnosis not present

## 2020-07-06 ENCOUNTER — Ambulatory Visit
Admission: RE | Admit: 2020-07-06 | Discharge: 2020-07-06 | Disposition: A | Discharge status: Home / Self Care | Payer: Medicare Other | Source: Ambulatory Visit | Attending: Internal Medicine | Admitting: Internal Medicine

## 2020-07-06 ENCOUNTER — Other Ambulatory Visit: Payer: Self-pay

## 2020-07-06 DIAGNOSIS — I700 Atherosclerosis of aorta: Secondary | ICD-10-CM | POA: Diagnosis not present

## 2020-07-06 DIAGNOSIS — I2510 Atherosclerotic heart disease of native coronary artery without angina pectoris: Secondary | ICD-10-CM | POA: Diagnosis not present

## 2020-07-06 DIAGNOSIS — J984 Other disorders of lung: Secondary | ICD-10-CM | POA: Diagnosis not present

## 2020-07-06 DIAGNOSIS — J986 Disorders of diaphragm: Secondary | ICD-10-CM

## 2021-02-01 DIAGNOSIS — M810 Age-related osteoporosis without current pathological fracture: Secondary | ICD-10-CM | POA: Diagnosis not present

## 2021-02-01 DIAGNOSIS — Z0000 Encounter for general adult medical examination without abnormal findings: Secondary | ICD-10-CM | POA: Diagnosis not present

## 2021-02-01 DIAGNOSIS — I10 Essential (primary) hypertension: Secondary | ICD-10-CM | POA: Diagnosis not present

## 2021-02-01 DIAGNOSIS — E1159 Type 2 diabetes mellitus with other circulatory complications: Secondary | ICD-10-CM | POA: Diagnosis not present

## 2021-02-01 DIAGNOSIS — E785 Hyperlipidemia, unspecified: Secondary | ICD-10-CM | POA: Diagnosis not present

## 2021-02-01 DIAGNOSIS — R82998 Other abnormal findings in urine: Secondary | ICD-10-CM | POA: Diagnosis not present

## 2021-02-01 DIAGNOSIS — E663 Overweight: Secondary | ICD-10-CM | POA: Diagnosis not present

## 2021-02-01 DIAGNOSIS — E119 Type 2 diabetes mellitus without complications: Secondary | ICD-10-CM | POA: Diagnosis not present

## 2021-03-30 DIAGNOSIS — D225 Melanocytic nevi of trunk: Secondary | ICD-10-CM | POA: Diagnosis not present

## 2021-03-30 DIAGNOSIS — L821 Other seborrheic keratosis: Secondary | ICD-10-CM | POA: Diagnosis not present

## 2021-03-30 DIAGNOSIS — Z85828 Personal history of other malignant neoplasm of skin: Secondary | ICD-10-CM | POA: Diagnosis not present

## 2021-03-30 DIAGNOSIS — L570 Actinic keratosis: Secondary | ICD-10-CM | POA: Diagnosis not present

## 2021-08-28 DIAGNOSIS — M810 Age-related osteoporosis without current pathological fracture: Secondary | ICD-10-CM | POA: Diagnosis not present

## 2021-08-28 DIAGNOSIS — I10 Essential (primary) hypertension: Secondary | ICD-10-CM | POA: Diagnosis not present

## 2021-08-28 DIAGNOSIS — E1159 Type 2 diabetes mellitus with other circulatory complications: Secondary | ICD-10-CM | POA: Diagnosis not present

## 2021-08-28 DIAGNOSIS — E785 Hyperlipidemia, unspecified: Secondary | ICD-10-CM | POA: Diagnosis not present

## 2021-09-07 DIAGNOSIS — Z23 Encounter for immunization: Secondary | ICD-10-CM | POA: Diagnosis not present

## 2021-11-08 DIAGNOSIS — H2513 Age-related nuclear cataract, bilateral: Secondary | ICD-10-CM | POA: Diagnosis not present

## 2021-11-08 DIAGNOSIS — E119 Type 2 diabetes mellitus without complications: Secondary | ICD-10-CM | POA: Diagnosis not present

## 2021-11-08 DIAGNOSIS — H524 Presbyopia: Secondary | ICD-10-CM | POA: Diagnosis not present

## 2021-11-09 DIAGNOSIS — Z794 Long term (current) use of insulin: Secondary | ICD-10-CM | POA: Diagnosis not present

## 2021-11-09 DIAGNOSIS — E1159 Type 2 diabetes mellitus with other circulatory complications: Secondary | ICD-10-CM | POA: Diagnosis not present

## 2021-11-09 DIAGNOSIS — E785 Hyperlipidemia, unspecified: Secondary | ICD-10-CM | POA: Diagnosis not present

## 2021-11-09 DIAGNOSIS — I10 Essential (primary) hypertension: Secondary | ICD-10-CM | POA: Diagnosis not present

## 2021-11-10 DIAGNOSIS — H526 Other disorders of refraction: Secondary | ICD-10-CM | POA: Diagnosis not present

## 2022-02-15 DIAGNOSIS — Z794 Long term (current) use of insulin: Secondary | ICD-10-CM | POA: Diagnosis not present

## 2022-02-15 DIAGNOSIS — E1159 Type 2 diabetes mellitus with other circulatory complications: Secondary | ICD-10-CM | POA: Diagnosis not present

## 2022-02-15 DIAGNOSIS — I2510 Atherosclerotic heart disease of native coronary artery without angina pectoris: Secondary | ICD-10-CM | POA: Diagnosis not present

## 2022-02-15 DIAGNOSIS — I10 Essential (primary) hypertension: Secondary | ICD-10-CM | POA: Diagnosis not present

## 2022-03-21 DIAGNOSIS — M810 Age-related osteoporosis without current pathological fracture: Secondary | ICD-10-CM | POA: Diagnosis not present

## 2022-03-21 DIAGNOSIS — I10 Essential (primary) hypertension: Secondary | ICD-10-CM | POA: Diagnosis not present

## 2022-03-21 DIAGNOSIS — E1159 Type 2 diabetes mellitus with other circulatory complications: Secondary | ICD-10-CM | POA: Diagnosis not present

## 2022-03-21 DIAGNOSIS — E785 Hyperlipidemia, unspecified: Secondary | ICD-10-CM | POA: Diagnosis not present

## 2022-03-28 DIAGNOSIS — E1159 Type 2 diabetes mellitus with other circulatory complications: Secondary | ICD-10-CM | POA: Diagnosis not present

## 2022-03-28 DIAGNOSIS — M79672 Pain in left foot: Secondary | ICD-10-CM | POA: Diagnosis not present

## 2022-03-28 DIAGNOSIS — R82998 Other abnormal findings in urine: Secondary | ICD-10-CM | POA: Diagnosis not present

## 2022-03-28 DIAGNOSIS — Z23 Encounter for immunization: Secondary | ICD-10-CM | POA: Diagnosis not present

## 2022-03-28 DIAGNOSIS — E663 Overweight: Secondary | ICD-10-CM | POA: Diagnosis not present

## 2022-03-28 DIAGNOSIS — Z0000 Encounter for general adult medical examination without abnormal findings: Secondary | ICD-10-CM | POA: Diagnosis not present

## 2022-03-28 DIAGNOSIS — E785 Hyperlipidemia, unspecified: Secondary | ICD-10-CM | POA: Diagnosis not present

## 2022-04-02 DIAGNOSIS — M79672 Pain in left foot: Secondary | ICD-10-CM | POA: Diagnosis not present

## 2022-04-02 DIAGNOSIS — M25572 Pain in left ankle and joints of left foot: Secondary | ICD-10-CM | POA: Diagnosis not present

## 2022-04-03 DIAGNOSIS — M810 Age-related osteoporosis without current pathological fracture: Secondary | ICD-10-CM | POA: Diagnosis not present

## 2022-05-31 DIAGNOSIS — Z794 Long term (current) use of insulin: Secondary | ICD-10-CM | POA: Diagnosis not present

## 2022-05-31 DIAGNOSIS — E785 Hyperlipidemia, unspecified: Secondary | ICD-10-CM | POA: Diagnosis not present

## 2022-05-31 DIAGNOSIS — I10 Essential (primary) hypertension: Secondary | ICD-10-CM | POA: Diagnosis not present

## 2022-05-31 DIAGNOSIS — E1159 Type 2 diabetes mellitus with other circulatory complications: Secondary | ICD-10-CM | POA: Diagnosis not present

## 2022-06-04 ENCOUNTER — Encounter: Payer: Self-pay | Admitting: Cardiology

## 2022-06-04 ENCOUNTER — Ambulatory Visit: Payer: Medicare Other | Attending: Cardiology | Admitting: Cardiology

## 2022-06-04 VITALS — BP 130/70 | HR 80 | Ht 68.0 in | Wt 201.0 lb

## 2022-06-04 DIAGNOSIS — I10 Essential (primary) hypertension: Secondary | ICD-10-CM

## 2022-06-04 DIAGNOSIS — E7800 Pure hypercholesterolemia, unspecified: Secondary | ICD-10-CM | POA: Diagnosis not present

## 2022-06-04 DIAGNOSIS — R0609 Other forms of dyspnea: Secondary | ICD-10-CM

## 2022-06-04 DIAGNOSIS — I2510 Atherosclerotic heart disease of native coronary artery without angina pectoris: Secondary | ICD-10-CM | POA: Diagnosis not present

## 2022-06-04 DIAGNOSIS — I2583 Coronary atherosclerosis due to lipid rich plaque: Secondary | ICD-10-CM | POA: Diagnosis not present

## 2022-06-04 NOTE — Progress Notes (Signed)
Cardiology Office Note:    Date:  06/04/2022   ID:  WAI LITT, DOB [birth year], MRN [id number]  PCP:  Crist Infante, [facility] Cardiologist:  Candee Furbish, MD     Referring MD: Crist Infante, MD    History of Present Illness:    [name] is a 81 y.o. male here for the evaluation of chest pain at the request of Dr. Crist Infante.  He states that he has been experiencing  dyspnea on exertion with stairs for instance.  This has been increasing in intensity recently.  I personally reviewed and interpreted his CT of chest in 2022 which demonstrates significant coronary artery calcification, multivessel including the RCA LAD and circumflex.  Father unknown.  Mother no early family history of CAD.  Altace is new one month ago. Takes at night. Better now. No more dizziness.   ASA easy lip bruising, forearm bruising.  No longer takes this medication.  Not interested in restarting.  Past Medical History:  Diagnosis Date   Actinic keratosis    Allergic rhinitis    seasonal    Benign localized prostatic hyperplasia with lower urinary tract symptoms (LUTS)    urologist-- dr Junious Silk   Colon polyps    Complication of anesthesia    per Pt after back surgery 05/ 2019 couldn't sleep x 7 days, hypervigilant   DDD (degenerative disc disease), lumbar    Diverticulosis    Erectile dysfunction    GERD (gastroesophageal reflux disease)    History of bladder stone    History of kidney stones    Hyperlipidemia    Hypertension    OA (osteoarthritis)    hips   Osteoporosis    Spinal stenosis    Spondylosis    Type 2 diabetes mellitus treated with insulin ([place])    followed by pcp   Wears glasses     Past Surgical History:  Procedure Laterality Date   APPENDECTOMY  1975   COLONOSCOPY     CYSTOSCOPY WITH LITHOLAPAXY N/A 02/14/2018   Procedure: CYSTOSCOPY WITH LITHOLAPAXY;  Surgeon: Festus Aloe, MD;  Location: [facility];  Service: Urology;  Laterality:  N/A;  ONLY NEEDS 90 MIN TOTAL FOR ALL PROCEDURES   FRACTURE SURGERY Left 05/ 10/ 2010   ROD DOWN IN LEFT LEG;   tib-fib fx's;   per pt hardware remains   LUMBAR LAMINECTOMY/DECOMPRESSION MICRODISCECTOMY Bilateral 11/21/2017   Procedure: Laminectomy and Foraminotomy - Lumbar Three-Lumbar Four - Lumbar Four-Lumbar Five - bilateral;  Surgeon: Eustace Moore, MD;  Location: [facility] OR;  Service: Neurosurgery;  Laterality: Bilateral;  Laminectomy and Foraminotomy - Lumbar Three-Lumbar Four - Lumbar Four-Lumbar Five - bilateral   THULIUM LASER TURP (TRANSURETHRAL RESECTION OF PROSTATE) N/A 02/14/2018   Procedure: THULIUM LASER TURP (TRANSURETHRAL RESECTION OF PROSTATE);  Surgeon: Festus Aloe, MD;  Location: [facility];  Service: Urology;  Laterality: N/A;   TOTAL HIP ARTHROPLASTY Right 12/31/2018   Procedure: TOTAL HIP ARTHROPLASTY ANTERIOR APPROACH;  Surgeon: Gaynelle Arabian, MD;  Location: [facility];  Service: Orthopedics;  Laterality: Right;  130mn    Current Medications: Current Meds  Medication Sig   amLODipine (NORVASC) 5 MG tablet Take 5 mg by mouth daily.   finasteride (PROSCAR) 5 MG tablet Take 5 mg by mouth every morning.    HUMALOG KWIKPEN 100 UNIT/ML KwikPen Inject 10 Units into the skin in the morning and at bedtime.    metFORMIN (GLUCOPHAGE) 1000 MG tablet Take 1,000 mg by mouth daily  with breakfast.    omeprazole (PRILOSEC) 20 MG capsule Take 20 mg by mouth every morning.    pioglitazone (ACTOS) 30 MG tablet Take 30 mg by mouth every morning.    ramipril (ALTACE) 2.5 MG capsule Take 2.5 mg by mouth every morning.    rosuvastatin (CRESTOR) 20 MG tablet Take 20 mg by mouth every morning.    TOUJEO SOLOSTAR 300 UNIT/ML SOPN Inject 45 Units as directed every morning.      Allergies:   Peanut-containing drug products   Social History   Socioeconomic History   Marital status: Divorced    Spouse name: Not on file   Number of children: 2   Years of education: Not on file   Highest education  level: Not on file  Occupational History   Occupation: retired    Fish farm manager: [employer]  Tobacco Use   Smoking status: Never   Smokeless tobacco: Never  Vaping Use   Vaping Use: Never used  Substance and Sexual Activity   Alcohol use: No   Drug use: Never   Sexual activity: Not on file  Other Topics Concern   Not on file  Social History Narrative   Patient is retired from [employer].  He is divorced and has 2 children.   Never smoker nondrinker no drug use   Social Determinants of Radio broadcast assistant Strain: Not on file  Food Insecurity: Not on file  Transportation Needs: Not on file  Physical Activity: Not on file  Stress: Not on file  Social Connections: Not on file     Family History: The patient's family history includes Alcoholism in his father; Liver disease in his father; Pancreatic cancer in his sister; Pulmonary fibrosis in his mother. There is no history of Colon cancer or Esophageal cancer.  ROS:   Please see the history of present illness.     All other systems reviewed and are negative.  EKGs/Labs/Other Studies Reviewed:    The following studies were reviewed today: As above  EKG:  The ekg ordered today demonstrates normal sinus rhythm 80 with no other significant abnormalities.  Recent Labs: No results found for requested labs within last 365 days.  Recent Lipid Panel No results found for: "CHOL", "TRIG", "HDL", "CHOLHDL", "VLDL", "LDLCALC", "LDLDIRECT"   Risk Assessment/Calculations:               Physical Exam:    VS:  BP 130/70 (BP Location: Left Arm, Patient Position: Sitting, Cuff Size: Normal)   Pulse 80   Ht '5\' 8"'$  (1.727 m)   Wt 201 lb (91.2 kg)   BMI 30.56 kg/m     Wt Readings from Last 3 Encounters:  06/04/22 201 lb (91.2 kg)  05/06/20 206 lb (93.4 kg)  05/03/20 206 lb 3.2 oz (93.5 kg)     GEN:  Well nourished, well developed in no acute distress HEENT: Normal NECK: No JVD; No carotid bruits LYMPHATICS: No  lymphadenopathy CARDIAC: RRR, no murmurs, rubs, gallops RESPIRATORY:  Clear to auscultation without rales, wheezing or rhonchi  ABDOMEN: Soft, non-tender, non-distended MUSCULOSKELETAL:  No edema; No deformity  SKIN: Warm and dry NEUROLOGIC:  Alert and oriented x 3 PSYCHIATRIC:  Normal affect   ASSESSMENT:    1. DOE (dyspnea on exertion)   2. Coronary artery disease due to lipid rich plaque   3. Primary hypertension   4. Pure hypercholesterolemia    PLAN:    In order of problems listed above:  81 year old with multivessel coronary  artery disease, coronary artery calcification, hyperlipidemia, hypertension, dyspnea on exertion which is possibly an anginal equivalent.  Dyspnea on exertion/coronary artery disease multivessel - We will check an echocardiogram to ensure proper structure and function of his heart - We will check a cardiac PET scan at [facility] long facility given his multivessel coronary artery disease to depict his coronary flow and to make sure that we do not miss significant triple-vessel disease.  Hyperlipidemia -Continue with Crestor 20 mg excellent.  His LDL is at goal of less than 70.  Dr. Joylene Draft has been monitoring closely.  No myalgias.  Hypertension - Recent addition of Altace or ramipril which he takes at night.  This is helped his blood pressure out significantly.  He is not experiencing any further dizziness.       Shared Decision Making/Informed Consent The risks [chest pain, shortness of breath, cardiac arrhythmias, dizziness, blood pressure fluctuations, myocardial infarction, stroke/transient ischemic attack, nausea, vomiting, allergic reaction, radiation exposure, metallic taste sensation and life-threatening complications (estimated to be 1 in 10,000)], benefits (risk stratification, diagnosing coronary artery disease, treatment guidance) and alternatives of a cardiac PET stress test were discussed in detail with Mr. Brandel and he agrees to proceed.     Medication Adjustments/Labs and Tests Ordered: Current medicines are reviewed at length with the patient today.  Concerns regarding medicines are outlined above.  Orders Placed This Encounter  Procedures   NM PET CT CARDIAC PERFUSION MULTI W/ABSOLUTE BLOODFLOW   Cardiac Stress Test: Informed Consent Details: Physician/Practitioner Attestation; Transcribe to consent form and obtain patient signature   EKG 12-Lead   ECHOCARDIOGRAM COMPLETE   No orders of the defined types were placed in this encounter.   Patient Instructions  Medication Instructions:  The current medical regimen is effective;  continue present plan and medications.  *If you need a refill on your cardiac medications before your next appointment, please call your pharmacy*  Testing/Procedures: Your physician has requested that you have an echocardiogram. Echocardiography is a painless test that uses sound waves to create images of your heart. It provides your doctor with information about the size and shape of your heart and how well your heart's chambers and valves are working. This procedure takes approximately one hour. There are no restrictions for this procedure. Please do NOT wear cologne, perfume, aftershave, or lotions (deodorant is allowed). Please arrive 15 minutes prior to your appointment time.    How to Prepare for Your Cardiac PET/CT Stress Test:  1. Please do not take these medications before your test:   Medications that may interfere with the cardiac pharmacological stress agent (ex. nitrates - including erectile dysfunction medications or beta-blockers) the day of the exam. (Erectile dysfunction medication should be held for at least 72 hrs prior to test) Theophylline containing medications for 12 hours. Dipyridamole 48 hours prior to the test. Your remaining medications may be taken with water.  2. Nothing to eat or drink, except water, 3 hours prior to arrival time.   NO caffeine/decaffeinated  products, or chocolate 12 hours prior to arrival.  3. NO perfume, cologne or lotion  4. Total time is 1 to 2 hours; you may want to bring reading material for the waiting time.  5. Please report to Admitting at the [facility] Main Entrance 60 minutes early for your test.  [address], [place] [id number]  Diabetic Preparation:  Hold oral medications. You may take NPH and Lantus insulin. Do not take Humalog or Humulin R (Regular  Insulin) the day of your test. Check blood sugars prior to leaving the house. If able to eat breakfast prior to 3 hour fasting, you may take all medications, including your insulin, Do not worry if you miss your breakfast dose of insulin - start at your next meal.  IF YOU THINK YOU MAY BE PREGNANT, OR ARE NURSING PLEASE INFORM THE TECHNOLOGIST.  In preparation for your appointment, medication and supplies will be purchased.  Appointment availability is limited, so if you need to cancel or reschedule, please call the Radiology Department at [phone]  24 hours in advance to avoid a cancellation fee of $100.00  What to Expect After you Arrive:  Once you arrive and check in for your appointment, you will be taken to a preparation room within the Radiology Department.  A technologist or Nurse will obtain your medical history, verify that you are correctly prepped for the exam, and explain the procedure.  Afterwards,  an IV will be started in your arm and electrodes will be placed on your skin for EKG monitoring during the stress portion of the exam. Then you will be escorted to the PET/CT scanner.  There, staff will get you positioned on the scanner and obtain a blood pressure and EKG.  During the exam, you will continue to be connected to the EKG and blood pressure machines.  A small, safe amount of a radioactive tracer will be injected in your IV to obtain a series of pictures of your heart along with an injection of a stress agent.    After  your Exam:  It is recommended that you eat a meal and drink a caffeinated beverage to counter act any effects of the stress agent.  Drink plenty of fluids for the remainder of the day and urinate frequently for the first couple of hours after the exam.  Your doctor will inform you of your test results within 7-10 business days.  For questions about your test or how to prepare for your test, please call: Marchia Bond, Cardiac Imaging Nurse Navigator  Gordy Clement, Cardiac Imaging Nurse Navigator Office: [phone]   Follow-Up: At [facility] you and your health needs are our priority.  As part of our continuing mission to provide you with exceptional heart care, we have created designated Provider Care Teams.  These Care Teams include your primary Cardiologist (physician) and Advanced Practice Providers (APPs -  Physician Assistants and Nurse Practitioners) who all work together to provide you with the care you need, when you need it.  We recommend signing up for the patient portal called "MyChart".  Sign up information is provided on this After Visit Summary.  MyChart is used to connect with patients for Virtual Visits (Telemedicine).  Patients are able to view lab/test results, encounter notes, upcoming appointments, etc.  Non-urgent messages can be sent to your provider as well.   To learn more about what you can do with MyChart, go to [email or web address].    Your next appointment:   1 year(s)  The format for your next appointment:   In Person  Provider:   Candee Furbish, MD      Important Information About Sugar         Signed, Candee Furbish, MD  06/04/2022 10:56 AM    Zachary Vaughn

## 2022-06-04 NOTE — Patient Instructions (Signed)
Medication Instructions:  The current medical regimen is effective;  continue present plan and medications.  *If you need a refill on your cardiac medications before your next appointment, please call your pharmacy*  Testing/Procedures: Your physician has requested that you have an echocardiogram. Echocardiography is a painless test that uses sound waves to create images of your heart. It provides your doctor with information about the size and shape of your heart and how well your heart's chambers and valves are working. This procedure takes approximately one hour. There are no restrictions for this procedure. Please do NOT wear cologne, perfume, aftershave, or lotions (deodorant is allowed). Please arrive 15 minutes prior to your appointment time.    How to Prepare for Your Cardiac PET/CT Stress Test:  1. Please do not take these medications before your test:   Medications that may interfere with the cardiac pharmacological stress agent (ex. nitrates - including erectile dysfunction medications or beta-blockers) the day of the exam. (Erectile dysfunction medication should be held for at least 72 hrs prior to test) Theophylline containing medications for 12 hours. Dipyridamole 48 hours prior to the test. Your remaining medications may be taken with water.  2. Nothing to eat or drink, except water, 3 hours prior to arrival time.   NO caffeine/decaffeinated products, or chocolate 12 hours prior to arrival.  3. NO perfume, cologne or lotion  4. Total time is 1 to 2 hours; you may want to bring reading material for the waiting time.  5. Please report to Admitting at the [facility] Main Entrance 60 minutes early for your test.  [place], [place] [id number]  Diabetic Preparation:  Hold oral medications. You may take NPH and Lantus insulin. Do not take Humalog or Humulin R (Regular Insulin) the day of your test. Check blood sugars prior to leaving the  house. If able to eat breakfast prior to 3 hour fasting, you may take all medications, including your insulin, Do not worry if you miss your breakfast dose of insulin - start at your next meal.  IF YOU THINK YOU MAY BE PREGNANT, OR ARE NURSING PLEASE INFORM THE TECHNOLOGIST.  In preparation for your appointment, medication and supplies will be purchased.  Appointment availability is limited, so if you need to cancel or reschedule, please call the Radiology Department at [phone]  24 hours in advance to avoid a cancellation fee of $100.00  What to Expect After you Arrive:  Once you arrive and check in for your appointment, you will be taken to a preparation room within the Radiology Department.  A technologist or Nurse will obtain your medical history, verify that you are correctly prepped for the exam, and explain the procedure.  Afterwards,  an IV will be started in your arm and electrodes will be placed on your skin for EKG monitoring during the stress portion of the exam. Then you will be escorted to the PET/CT scanner.  There, staff will get you positioned on the scanner and obtain a blood pressure and EKG.  During the exam, you will continue to be connected to the EKG and blood pressure machines.  A small, safe amount of a radioactive tracer will be injected in your IV to obtain a series of pictures of your heart along with an injection of a stress agent.    After your Exam:  It is recommended that you eat a meal and drink a caffeinated beverage to counter act any effects of the stress agent.  Drink plenty of fluids for the remainder of the day and urinate frequently for the first couple of hours after the exam.  Your doctor will inform you of your test results within 7-10 business days.  For questions about your test or how to prepare for your test, please call: Marchia Bond, Cardiac Imaging Nurse Navigator  Gordy Clement, Cardiac Imaging Nurse Navigator Office:  [phone]   Follow-Up: At [facility], you and your health needs are our priority.  As part of our continuing mission to provide you with exceptional heart care, we have created designated Provider Care Teams.  These Care Teams include your primary Cardiologist (physician) and Advanced Practice Providers (APPs -  Physician Assistants and Nurse Practitioners) who all work together to provide you with the care you need, when you need it.  We recommend signing up for the patient portal called "MyChart".  Sign up information is provided on this After Visit Summary.  MyChart is used to connect with patients for Virtual Visits (Telemedicine).  Patients are able to view lab/test results, encounter notes, upcoming appointments, etc.  Non-urgent messages can be sent to your provider as well.   To learn more about what you can do with MyChart, go to [email or web address].    Your next appointment:   1 year(s)  The format for your next appointment:   In Person  Provider:   Candee Furbish, MD      Important Information About Sugar

## 2022-06-22 ENCOUNTER — Ambulatory Visit (HOSPITAL_COMMUNITY): Payer: Medicare Other | Attending: Cardiology

## 2022-06-22 DIAGNOSIS — R0609 Other forms of dyspnea: Secondary | ICD-10-CM | POA: Diagnosis not present

## 2022-06-22 DIAGNOSIS — I2583 Coronary atherosclerosis due to lipid rich plaque: Secondary | ICD-10-CM | POA: Diagnosis not present

## 2022-06-22 DIAGNOSIS — I2510 Atherosclerotic heart disease of native coronary artery without angina pectoris: Secondary | ICD-10-CM | POA: Diagnosis not present

## 2022-06-22 LAB — ECHOCARDIOGRAM COMPLETE
Area-P 1/2: 4.15 cm2
S' Lateral: 2.8 cm

## 2022-06-29 ENCOUNTER — Telehealth (HOSPITAL_COMMUNITY): Payer: Self-pay | Admitting: *Deleted

## 2022-06-29 NOTE — Telephone Encounter (Signed)
Reaching out to patient to offer assistance regarding upcoming cardiac imaging study; pt verbalizes understanding of appt date/time, parking situation and where to check in, pre-test NPO status and verified current allergies; name and call back number provided for further questions should they arise  Gordy Clement RN Navigator Cardiac Imaging [facility] [phone] office [phone] cell  Patient aware to avoid caffeine 12 hours prior to his cardiac PET test.  He was told to avoid food 3 hours prior but he intends to fast all morning.  Advised him to not take his metformin prior to test if he is not eating.

## 2022-07-03 ENCOUNTER — Ambulatory Visit (HOSPITAL_COMMUNITY)
Admission: RE | Admit: 2022-07-03 | Discharge: 2022-07-03 | Disposition: A | Discharge status: Home / Self Care | Payer: Medicare Other | Source: Ambulatory Visit | Attending: Cardiology | Admitting: Cardiology

## 2022-07-03 DIAGNOSIS — I2583 Coronary atherosclerosis due to lipid rich plaque: Secondary | ICD-10-CM | POA: Diagnosis not present

## 2022-07-03 DIAGNOSIS — I2510 Atherosclerotic heart disease of native coronary artery without angina pectoris: Secondary | ICD-10-CM

## 2022-07-03 DIAGNOSIS — R0609 Other forms of dyspnea: Secondary | ICD-10-CM

## 2022-07-03 LAB — NM PET CT CARDIAC PERFUSION MULTI W/ABSOLUTE BLOODFLOW
LV dias vol: 92 mL (ref 62–150)
LV sys vol: 29 mL
MBFR: 2.54
Rest MBF: 0.97 ml/g/min
Rest Nuclear Isotope Dose: 23.7 mCi
ST Depression (mm): 0 mm
Stress MBF: 2.46 ml/g/min
Stress Nuclear Isotope Dose: 23.7 mCi
TID: 1.05

## 2022-07-03 MED ORDER — RUBIDIUM RB82 GENERATOR (RUBYFILL)
23.0000 | PACK | Freq: Once | INTRAVENOUS | Status: AC
  Administered 2022-07-03: 23.7 via INTRAVENOUS

## 2022-07-03 MED ORDER — REGADENOSON 0.4 MG/5ML IV SOLN: 0.4000 mg | Freq: Once | INTRAVENOUS | Status: AC

## 2022-07-03 MED ORDER — RUBIDIUM RB82 GENERATOR (RUBYFILL)
30.0000 | PACK | Freq: Once | INTRAVENOUS | Status: AC
  Administered 2022-07-03: 23.7 via INTRAVENOUS

## 2022-07-03 MED ORDER — REGADENOSON 0.4 MG/5ML IV SOLN
INTRAVENOUS | Status: AC
  Administered 2022-07-03: 0.4 mg via INTRAVENOUS
  Filled 2022-07-03: qty 5

## 2022-07-03 NOTE — Progress Notes (Signed)
Cardiac PET Scan complete and pt denies complaints. VS and orders assessed and pt has no s/s of distress. Cola given and tolerated it well. Will continue to monitor and tx pt according to MD orders.

## 2022-10-16 DIAGNOSIS — E1159 Type 2 diabetes mellitus with other circulatory complications: Secondary | ICD-10-CM | POA: Diagnosis not present

## 2022-10-16 DIAGNOSIS — E785 Hyperlipidemia, unspecified: Secondary | ICD-10-CM | POA: Diagnosis not present

## 2022-10-16 DIAGNOSIS — I700 Atherosclerosis of aorta: Secondary | ICD-10-CM | POA: Diagnosis not present

## 2022-10-16 DIAGNOSIS — Z794 Long term (current) use of insulin: Secondary | ICD-10-CM | POA: Diagnosis not present

## 2022-10-31 ENCOUNTER — Other Ambulatory Visit (HOSPITAL_COMMUNITY): Payer: Self-pay

## 2022-11-05 ENCOUNTER — Ambulatory Visit (HOSPITAL_COMMUNITY)
Admission: RE | Admit: 2022-11-05 | Discharge: 2022-11-05 | Disposition: A | Discharge status: Home / Self Care | Payer: Medicare Other | Source: Ambulatory Visit | Attending: Internal Medicine | Admitting: Internal Medicine

## 2022-11-05 DIAGNOSIS — M810 Age-related osteoporosis without current pathological fracture: Secondary | ICD-10-CM | POA: Insufficient documentation

## 2022-11-05 MED ORDER — DENOSUMAB 60 MG/ML ~~LOC~~ SOSY
60.0000 mg | PREFILLED_SYRINGE | Freq: Once | SUBCUTANEOUS | Status: AC
  Administered 2022-11-05: 60 mg via SUBCUTANEOUS

## 2022-11-05 MED ORDER — DENOSUMAB 60 MG/ML ~~LOC~~ SOSY
PREFILLED_SYRINGE | SUBCUTANEOUS | Status: AC
  Filled 2022-11-05: qty 1

## 2022-11-20 DIAGNOSIS — K080 Exfoliation of teeth due to systemic causes: Secondary | ICD-10-CM | POA: Diagnosis not present

## 2022-12-05 DIAGNOSIS — E119 Type 2 diabetes mellitus without complications: Secondary | ICD-10-CM | POA: Diagnosis not present

## 2022-12-05 DIAGNOSIS — H43813 Vitreous degeneration, bilateral: Secondary | ICD-10-CM | POA: Diagnosis not present

## 2022-12-05 DIAGNOSIS — H2513 Age-related nuclear cataract, bilateral: Secondary | ICD-10-CM | POA: Diagnosis not present

## 2022-12-05 DIAGNOSIS — H524 Presbyopia: Secondary | ICD-10-CM | POA: Diagnosis not present

## 2022-12-06 DIAGNOSIS — H524 Presbyopia: Secondary | ICD-10-CM | POA: Diagnosis not present

## 2023-01-01 DIAGNOSIS — I10 Essential (primary) hypertension: Secondary | ICD-10-CM | POA: Diagnosis not present

## 2023-01-01 DIAGNOSIS — R051 Acute cough: Secondary | ICD-10-CM | POA: Diagnosis not present

## 2023-01-31 DIAGNOSIS — E119 Type 2 diabetes mellitus without complications: Secondary | ICD-10-CM | POA: Diagnosis not present

## 2023-03-03 DIAGNOSIS — E119 Type 2 diabetes mellitus without complications: Secondary | ICD-10-CM | POA: Diagnosis not present

## 2023-04-02 DIAGNOSIS — E119 Type 2 diabetes mellitus without complications: Secondary | ICD-10-CM | POA: Diagnosis not present

## 2023-05-03 DIAGNOSIS — E119 Type 2 diabetes mellitus without complications: Secondary | ICD-10-CM | POA: Diagnosis not present

## 2023-05-09 DIAGNOSIS — M810 Age-related osteoporosis without current pathological fracture: Secondary | ICD-10-CM | POA: Diagnosis not present

## 2023-05-09 DIAGNOSIS — E1159 Type 2 diabetes mellitus with other circulatory complications: Secondary | ICD-10-CM | POA: Diagnosis not present

## 2023-05-09 DIAGNOSIS — N401 Enlarged prostate with lower urinary tract symptoms: Secondary | ICD-10-CM | POA: Diagnosis not present

## 2023-05-09 DIAGNOSIS — Z1389 Encounter for screening for other disorder: Secondary | ICD-10-CM | POA: Diagnosis not present

## 2023-05-13 DIAGNOSIS — Z23 Encounter for immunization: Secondary | ICD-10-CM | POA: Diagnosis not present

## 2023-05-13 DIAGNOSIS — Z1331 Encounter for screening for depression: Secondary | ICD-10-CM | POA: Diagnosis not present

## 2023-05-13 DIAGNOSIS — I10 Essential (primary) hypertension: Secondary | ICD-10-CM | POA: Diagnosis not present

## 2023-05-13 DIAGNOSIS — Z0000 Encounter for general adult medical examination without abnormal findings: Secondary | ICD-10-CM | POA: Diagnosis not present

## 2023-05-13 DIAGNOSIS — E1159 Type 2 diabetes mellitus with other circulatory complications: Secondary | ICD-10-CM | POA: Diagnosis not present

## 2023-05-13 DIAGNOSIS — R82998 Other abnormal findings in urine: Secondary | ICD-10-CM | POA: Diagnosis not present

## 2023-05-28 DIAGNOSIS — K080 Exfoliation of teeth due to systemic causes: Secondary | ICD-10-CM | POA: Diagnosis not present

## 2023-06-02 DIAGNOSIS — E119 Type 2 diabetes mellitus without complications: Secondary | ICD-10-CM | POA: Diagnosis not present

## 2023-06-03 ENCOUNTER — Other Ambulatory Visit (HOSPITAL_COMMUNITY): Payer: Self-pay

## 2023-06-04 ENCOUNTER — Ambulatory Visit (HOSPITAL_COMMUNITY)
Admission: RE | Admit: 2023-06-04 | Discharge: 2023-06-04 | Disposition: A | Discharge status: Home / Self Care | Payer: Medicare Other | Source: Ambulatory Visit | Attending: Internal Medicine | Admitting: Internal Medicine

## 2023-06-04 DIAGNOSIS — M810 Age-related osteoporosis without current pathological fracture: Secondary | ICD-10-CM | POA: Insufficient documentation

## 2023-06-04 MED ORDER — DENOSUMAB 60 MG/ML ~~LOC~~ SOSY
60.0000 mg | PREFILLED_SYRINGE | Freq: Once | SUBCUTANEOUS | Status: AC
  Administered 2023-06-04: 60 mg via SUBCUTANEOUS

## 2023-06-04 MED ORDER — DENOSUMAB 60 MG/ML ~~LOC~~ SOSY
PREFILLED_SYRINGE | SUBCUTANEOUS | Status: AC
  Filled 2023-06-04: qty 1

## 2023-07-03 DIAGNOSIS — E119 Type 2 diabetes mellitus without complications: Secondary | ICD-10-CM | POA: Diagnosis not present

## 2023-08-03 DIAGNOSIS — E119 Type 2 diabetes mellitus without complications: Secondary | ICD-10-CM | POA: Diagnosis not present

## 2023-08-31 DIAGNOSIS — E119 Type 2 diabetes mellitus without complications: Secondary | ICD-10-CM | POA: Diagnosis not present

## 2023-10-01 DIAGNOSIS — E119 Type 2 diabetes mellitus without complications: Secondary | ICD-10-CM | POA: Diagnosis not present

## 2023-10-31 DIAGNOSIS — E119 Type 2 diabetes mellitus without complications: Secondary | ICD-10-CM | POA: Diagnosis not present

## 2023-12-01 DIAGNOSIS — E119 Type 2 diabetes mellitus without complications: Secondary | ICD-10-CM | POA: Diagnosis not present

## 2023-12-06 DIAGNOSIS — H43813 Vitreous degeneration, bilateral: Secondary | ICD-10-CM | POA: Diagnosis not present

## 2023-12-06 DIAGNOSIS — H524 Presbyopia: Secondary | ICD-10-CM | POA: Diagnosis not present

## 2023-12-06 DIAGNOSIS — E119 Type 2 diabetes mellitus without complications: Secondary | ICD-10-CM | POA: Diagnosis not present

## 2023-12-06 DIAGNOSIS — H2513 Age-related nuclear cataract, bilateral: Secondary | ICD-10-CM | POA: Diagnosis not present

## 2023-12-09 DIAGNOSIS — I10 Essential (primary) hypertension: Secondary | ICD-10-CM | POA: Diagnosis not present

## 2023-12-09 DIAGNOSIS — E1159 Type 2 diabetes mellitus with other circulatory complications: Secondary | ICD-10-CM | POA: Diagnosis not present

## 2023-12-10 ENCOUNTER — Other Ambulatory Visit (HOSPITAL_COMMUNITY): Payer: Self-pay | Admitting: *Deleted

## 2023-12-11 DIAGNOSIS — K080 Exfoliation of teeth due to systemic causes: Secondary | ICD-10-CM | POA: Diagnosis not present

## 2023-12-12 ENCOUNTER — Ambulatory Visit (HOSPITAL_COMMUNITY)
Admission: RE | Admit: 2023-12-12 | Discharge: 2023-12-12 | Disposition: A | Discharge status: Home / Self Care | Source: Ambulatory Visit | Attending: Internal Medicine | Admitting: Internal Medicine

## 2023-12-12 DIAGNOSIS — M810 Age-related osteoporosis without current pathological fracture: Secondary | ICD-10-CM | POA: Insufficient documentation

## 2023-12-12 MED ORDER — DENOSUMAB 60 MG/ML ~~LOC~~ SOSY
PREFILLED_SYRINGE | SUBCUTANEOUS | Status: AC
Start: 2023-12-12 — End: 2023-12-12
  Filled 2023-12-12: qty 1

## 2023-12-12 MED ORDER — DENOSUMAB 60 MG/ML ~~LOC~~ SOSY
60.0000 mg | PREFILLED_SYRINGE | Freq: Once | SUBCUTANEOUS | Status: AC
  Administered 2023-12-12: 60 mg via SUBCUTANEOUS

## 2023-12-31 DIAGNOSIS — E119 Type 2 diabetes mellitus without complications: Secondary | ICD-10-CM | POA: Diagnosis not present

## 2024-01-31 DIAGNOSIS — E119 Type 2 diabetes mellitus without complications: Secondary | ICD-10-CM | POA: Diagnosis not present

## 2024-02-28 DIAGNOSIS — K080 Exfoliation of teeth due to systemic causes: Secondary | ICD-10-CM | POA: Diagnosis not present

## 2024-03-02 DIAGNOSIS — E119 Type 2 diabetes mellitus without complications: Secondary | ICD-10-CM | POA: Diagnosis not present

## 2024-03-24 DIAGNOSIS — K080 Exfoliation of teeth due to systemic causes: Secondary | ICD-10-CM | POA: Diagnosis not present

## 2024-04-01 DIAGNOSIS — E119 Type 2 diabetes mellitus without complications: Secondary | ICD-10-CM | POA: Diagnosis not present

## 2024-05-02 DIAGNOSIS — E119 Type 2 diabetes mellitus without complications: Secondary | ICD-10-CM | POA: Diagnosis not present

## 2024-05-11 DIAGNOSIS — M810 Age-related osteoporosis without current pathological fracture: Secondary | ICD-10-CM | POA: Diagnosis not present

## 2024-05-11 DIAGNOSIS — E1159 Type 2 diabetes mellitus with other circulatory complications: Secondary | ICD-10-CM | POA: Diagnosis not present

## 2024-05-11 DIAGNOSIS — Z23 Encounter for immunization: Secondary | ICD-10-CM | POA: Diagnosis not present

## 2024-05-11 LAB — HM DEXA SCAN: HM Dexa Scan: NORMAL

## 2024-05-15 ENCOUNTER — Other Ambulatory Visit (HOSPITAL_COMMUNITY): Payer: Self-pay | Admitting: Internal Medicine

## 2024-05-15 DIAGNOSIS — M810 Age-related osteoporosis without current pathological fracture: Secondary | ICD-10-CM | POA: Insufficient documentation

## 2024-05-18 ENCOUNTER — Telehealth (HOSPITAL_COMMUNITY): Payer: Self-pay | Admitting: Pharmacy Technician

## 2024-05-18 NOTE — Telephone Encounter (Signed)
 Auth Submission: PENDING Site of care: [facility] Payer: [employer] MEDICARE Medication & CPT/J Code(s) submitted: Q5136 JUBBONTI (denosumab ) Diagnosis Code: M81.0 Route of submission (phone, fax, portal): Latent/fax Phone # Fax # Auth type: Buy/Bill HB Units/visits requested: 60mg  x 2 doses, q 6 months Reference number:  Approval from: *** to ***    Dagoberto Armour, CPhT [facility] Phone: [phone] 05/18/2024

## 2024-05-19 ENCOUNTER — Other Ambulatory Visit (HOSPITAL_COMMUNITY): Payer: Self-pay | Admitting: Internal Medicine

## 2024-06-19 ENCOUNTER — Ambulatory Visit (HOSPITAL_COMMUNITY)
Admission: RE | Admit: 2024-06-19 | Discharge: 2024-06-19 | Disposition: A | Discharge status: Home / Self Care | Source: Ambulatory Visit | Attending: Internal Medicine | Admitting: Internal Medicine

## 2024-06-19 ENCOUNTER — Telehealth (HOSPITAL_COMMUNITY): Payer: Self-pay | Admitting: Internal Medicine

## 2024-06-19 VITALS — BP 135/76 | HR 65 | Temp 97.5°F | Resp 16

## 2024-06-19 DIAGNOSIS — M810 Age-related osteoporosis without current pathological fracture: Secondary | ICD-10-CM | POA: Insufficient documentation

## 2024-06-19 MED ORDER — DENOSUMAB-BBDZ 60 MG/ML ~~LOC~~ SOSY
PREFILLED_SYRINGE | SUBCUTANEOUS | Status: AC
  Filled 2024-06-19: qty 1

## 2024-06-19 MED ORDER — DENOSUMAB-BBDZ 60 MG/ML ~~LOC~~ SOSY
60.0000 mg | PREFILLED_SYRINGE | Freq: Once | SUBCUTANEOUS | Status: AC
  Administered 2024-06-19: 60 mg via SUBCUTANEOUS

## 2024-06-19 NOTE — Telephone Encounter (Signed)
 Patient received Jubbonti injection today at the [facility] infusion clinic. Last Calcium  from June was 8.5 mg/dl. Provider would like for the patient to begin Caltrate with Vitamin D starting today. Spoke with patient regarding starting Caltrate with Vitamin D and discussed benefits of supplementation.   Thank you,  Norton Blush, PharmD, [facility] Pharmacist [facility]

## 2024-12-21 ENCOUNTER — Encounter (HOSPITAL_COMMUNITY)
